# Patient Record
Sex: Male | Born: 1965 | Race: White | Hispanic: No | Marital: Married | Smoking: Never smoker
Health system: Southern US, Community
[De-identification: ages and names within clinical notes are randomized; demographics above are authoritative.]

## PROBLEM LIST (undated history)

## (undated) DIAGNOSIS — Z973 Presence of spectacles and contact lenses: Secondary | ICD-10-CM

## (undated) DIAGNOSIS — C801 Malignant (primary) neoplasm, unspecified: Secondary | ICD-10-CM

## (undated) DIAGNOSIS — D494 Neoplasm of unspecified behavior of bladder: Secondary | ICD-10-CM

## (undated) DIAGNOSIS — E291 Testicular hypofunction: Secondary | ICD-10-CM

## (undated) DIAGNOSIS — J302 Other seasonal allergic rhinitis: Secondary | ICD-10-CM

## (undated) DIAGNOSIS — K219 Gastro-esophageal reflux disease without esophagitis: Secondary | ICD-10-CM

## (undated) DIAGNOSIS — R351 Nocturia: Secondary | ICD-10-CM

## (undated) HISTORY — DX: Testicular hypofunction: E29.1

## (undated) HISTORY — DX: Gastro-esophageal reflux disease without esophagitis: K21.9

## (undated) HISTORY — PX: CARDIAC CATHETERIZATION: SHX172

## (undated) HISTORY — PX: NO PAST SURGERIES: SHX2092

---

## 2002-10-28 ENCOUNTER — Encounter: Payer: Self-pay | Admitting: Family Medicine

## 2002-10-28 ENCOUNTER — Ambulatory Visit (HOSPITAL_COMMUNITY): Admission: RE | Admit: 2002-10-28 | Discharge: 2002-10-28 | Payer: Self-pay | Admitting: Family Medicine

## 2002-11-21 ENCOUNTER — Ambulatory Visit (HOSPITAL_COMMUNITY): Admission: RE | Admit: 2002-11-21 | Discharge: 2002-11-21 | Payer: Self-pay | Admitting: Cardiology

## 2009-05-13 ENCOUNTER — Encounter: Payer: Self-pay | Admitting: Internal Medicine

## 2009-05-28 ENCOUNTER — Ambulatory Visit: Payer: Self-pay | Admitting: Internal Medicine

## 2009-05-28 DIAGNOSIS — K219 Gastro-esophageal reflux disease without esophagitis: Secondary | ICD-10-CM

## 2009-05-28 DIAGNOSIS — E291 Testicular hypofunction: Secondary | ICD-10-CM

## 2009-05-28 DIAGNOSIS — R21 Rash and other nonspecific skin eruption: Secondary | ICD-10-CM | POA: Insufficient documentation

## 2009-05-28 LAB — CONVERTED CEMR LAB
ALT: 34 units/L (ref 0–53)
AST: 27 units/L (ref 0–37)
Albumin: 4.5 g/dL (ref 3.5–5.2)
Alkaline Phosphatase: 77 units/L (ref 39–117)
BUN: 11 mg/dL (ref 6–23)
Basophils Absolute: 0.1 10*3/uL (ref 0.0–0.1)
Basophils Relative: 1 % (ref 0.0–3.0)
Bilirubin, Direct: 0 mg/dL (ref 0.0–0.3)
CO2: 22 meq/L (ref 19–32)
Calcium: 10.1 mg/dL (ref 8.4–10.5)
Chloride: 103 meq/L (ref 96–112)
Creatinine, Ser: 0.9 mg/dL (ref 0.4–1.5)
Eosinophils Absolute: 0.3 10*3/uL (ref 0.0–0.7)
Eosinophils Relative: 5.4 % — ABNORMAL HIGH (ref 0.0–5.0)
GFR calc non Af Amer: 97.15 mL/min (ref >60)
Glucose, Bld: 73 mg/dL (ref 70–99)
HCT: 43.4 % (ref 39.0–52.0)
Hemoglobin: 15.1 g/dL (ref 13.0–17.0)
Lymphocytes Relative: 31.2 % (ref 12.0–46.0)
Lymphs Abs: 2 10*3/uL (ref 0.7–4.0)
MCHC: 34.8 g/dL (ref 30.0–36.0)
MCV: 84.6 fL (ref 78.0–100.0)
Monocytes Absolute: 0.6 10*3/uL (ref 0.1–1.0)
Monocytes Relative: 8.9 % (ref 3.0–12.0)
Neutro Abs: 3.4 10*3/uL (ref 1.4–7.7)
Neutrophils Relative %: 53.5 % (ref 43.0–77.0)
Platelets: 238 10*3/uL (ref 150.0–400.0)
Potassium: 4.3 meq/L (ref 3.5–5.1)
RBC: 5.13 M/uL (ref 4.22–5.81)
RDW: 12 % (ref 11.5–14.6)
Sodium: 139 meq/L (ref 135–145)
TSH: 0.61 microintl units/mL (ref 0.35–5.50)
Testosterone: 256.61 ng/dL — ABNORMAL LOW (ref 350.00–890.00)
Total Bilirubin: 0.6 mg/dL (ref 0.3–1.2)
Total Protein: 6.6 g/dL (ref 6.0–8.3)
WBC: 6.4 10*3/uL (ref 4.5–10.5)

## 2009-05-31 ENCOUNTER — Encounter: Payer: Self-pay | Admitting: Internal Medicine

## 2009-06-01 ENCOUNTER — Encounter: Payer: Self-pay | Admitting: Internal Medicine

## 2009-06-16 ENCOUNTER — Ambulatory Visit: Payer: Self-pay | Admitting: Internal Medicine

## 2009-06-16 LAB — CONVERTED CEMR LAB
FSH: 9.4 milliintl units/mL (ref 1.4–18.1)
LH: 3.65 milliintl units/mL (ref 1.50–9.30)
Prolactin: 6.9 ng/mL
Testosterone: 248.88 ng/dL — ABNORMAL LOW (ref 350.00–890.00)

## 2009-08-02 ENCOUNTER — Ambulatory Visit: Payer: Self-pay | Admitting: Internal Medicine

## 2009-08-02 DIAGNOSIS — M7710 Lateral epicondylitis, unspecified elbow: Secondary | ICD-10-CM | POA: Insufficient documentation

## 2009-08-10 ENCOUNTER — Ambulatory Visit: Payer: Self-pay | Admitting: Internal Medicine

## 2009-08-10 DIAGNOSIS — N401 Enlarged prostate with lower urinary tract symptoms: Secondary | ICD-10-CM

## 2009-08-10 LAB — CONVERTED CEMR LAB
Bilirubin Urine: NEGATIVE
Cholesterol: 191 mg/dL (ref 0–200)
Direct LDL: 125 mg/dL
HDL: 28.5 mg/dL — ABNORMAL LOW (ref >39.00)
Hemoglobin, Urine: NEGATIVE
Ketones, ur: NEGATIVE mg/dL
Leukocytes, UA: NEGATIVE
Nitrite: NEGATIVE
PSA: 1.2 ng/mL (ref 0.10–4.00)
Specific Gravity, Urine: 1.025 (ref 1.000–1.030)
Total CHOL/HDL Ratio: 7
Total Protein, Urine: NEGATIVE mg/dL
Triglycerides: 212 mg/dL — ABNORMAL HIGH (ref 0.0–149.0)
Urine Glucose: NEGATIVE mg/dL
Urobilinogen, UA: 0.2 (ref 0.0–1.0)
VLDL: 42.4 mg/dL — ABNORMAL HIGH (ref 0.0–40.0)
pH: 6 (ref 5.0–8.0)

## 2009-08-11 ENCOUNTER — Encounter: Payer: Self-pay | Admitting: Internal Medicine

## 2009-10-21 ENCOUNTER — Telehealth: Payer: Self-pay | Admitting: Internal Medicine

## 2009-10-21 ENCOUNTER — Encounter: Payer: Self-pay | Admitting: Internal Medicine

## 2009-12-27 ENCOUNTER — Telehealth: Payer: Self-pay | Admitting: Internal Medicine

## 2010-03-07 ENCOUNTER — Ambulatory Visit: Payer: Self-pay | Admitting: Internal Medicine

## 2010-03-07 DIAGNOSIS — J019 Acute sinusitis, unspecified: Secondary | ICD-10-CM | POA: Insufficient documentation

## 2010-03-07 DIAGNOSIS — J3089 Other allergic rhinitis: Secondary | ICD-10-CM

## 2010-03-08 ENCOUNTER — Telehealth (INDEPENDENT_AMBULATORY_CARE_PROVIDER_SITE_OTHER): Payer: Self-pay | Admitting: *Deleted

## 2010-03-09 ENCOUNTER — Encounter: Payer: Self-pay | Admitting: Internal Medicine

## 2010-03-23 ENCOUNTER — Ambulatory Visit: Payer: Self-pay | Admitting: Internal Medicine

## 2010-07-19 ENCOUNTER — Telehealth: Payer: Self-pay | Admitting: Internal Medicine

## 2010-09-03 ENCOUNTER — Encounter: Payer: Self-pay | Admitting: Family Medicine

## 2010-09-03 ENCOUNTER — Ambulatory Visit
Admission: RE | Admit: 2010-09-03 | Discharge: 2010-09-03 | Payer: Self-pay | Source: Home / Self Care | Attending: Family Medicine | Admitting: Family Medicine

## 2010-09-03 DIAGNOSIS — J1189 Influenza due to unidentified influenza virus with other manifestations: Secondary | ICD-10-CM | POA: Insufficient documentation

## 2010-09-05 ENCOUNTER — Telehealth: Payer: Self-pay | Admitting: Internal Medicine

## 2010-09-20 NOTE — Progress Notes (Signed)
Summary: REFILL  Phone Note Refill Request Call back at [facility] Phone [phone] Call back at [phone] or [phone]   Refills Requested: Medication #1:  VIMOVO 500-20 MG TBEC One by mouth two times a day for elbow pain. Initial call taken by: Lamar Sprinkles, CMA,  October 21, 2009 9:31 AM  Follow-up for Phone Call        done Follow-up by: Etta Grandchild MD,  October 21, 2009 9:40 AM    Prescriptions: VIMOVO 500-20 MG TBEC (NAPROXEN-ESOMEPRAZOLE) One by mouth two times a day for elbow pain  #60 x 11   Entered and Authorized by:   Etta Grandchild MD   Signed by:   Etta Grandchild MD on 10/21/2009   Method used:   [facility]  [address] [address] [id number]       [address]       [place], [place]  [address]       Ph: [phone] or [phone]       Fax: [phone]   RxID:   [id number]

## 2010-09-20 NOTE — Progress Notes (Signed)
Summary: andro gel  Phone Note Refill Request Message from:  Fax from Pharmacy on Dec 27, 2009 12:40 PM  Refills Requested: Medication #1:  ANDROGEL 25 MG/2.5GM GEL Apply one to chest or shoulders once daily #75 GM   Dosage confirmed as above?Dosage Confirmed   Brand Name Necessary? Yes   Supply Requested: 6 months   Last Refilled: 11/08/2009 [id number] Last ov 08/10/09  Next Appointment Scheduled: none Initial call taken by: Orlan Leavens,  Dec 27, 2009 12:41 PM  Follow-up for Phone Call        ok Follow-up by: Etta Grandchild MD,  Dec 27, 2009 12:58 PM  Additional Follow-up for Phone Call Additional follow up Details #1::        [facility] Lisa/pharmacist ok x's 6. Updated EMR Additional Follow-up by: Orlan Leavens,  Dec 27, 2009 1:26 PM    Prescriptions: ANDROGEL 25 MG/2.5GM GEL (TESTOSTERONE) Apply one to chest or shoulders once daily  #30 x 5   Entered by:   Orlan Leavens   Authorized by:   Etta Grandchild MD   Signed by:   Orlan Leavens on 12/27/2009   Method used:   Telephoned to ...       [facility]  [address] [address] [id number]       [address]       [place], [place]  [address]       Ph: [phone] or [phone]       Fax: [phone]   RxID:   [id number]

## 2010-09-20 NOTE — Medication Information (Signed)
Summary: Authorization for PPA / [employer] [place]  Authorization for PPA / [employer] [place]   Imported By: Lennie Odor 03/10/2010 14:47:00  _____________________________________________________________________  External Attachment:    Type:   Image     Comment:   External Document

## 2010-09-20 NOTE — Medication Information (Signed)
Summary: Androgens / [employer] [place]  Androgens / [employer] [place]   Imported By: Lennie Odor 03/10/2010 14:49:10  _____________________________________________________________________  External Attachment:    Type:   Image     Comment:   External Document

## 2010-09-20 NOTE — Progress Notes (Signed)
Summary: PA-Androgens  Phone Note From Pharmacy   Request: Needs authorization from insurer Summary of Call: PA-Androgens faxed to [facility]  ([phone]) , awaiting approval. Initial call taken by: Dagoberto Reef,  March 08, 2010 2:42 PM  Follow-up for Phone Call        Approved 03/09/10-12/02/12, Ref # [id number], PT [place]WARE . Follow-up by: Dagoberto Reef,  March 09, 2010 2:51 PM

## 2010-09-20 NOTE — Progress Notes (Signed)
Summary: refill  Phone Note Refill Request Message from:  Fax from Pharmacy on July 19, 2010 11:49 AM  Refills Requested: Medication #1:  ANDROGEL 25 MG/2.5GM GEL Apply one to chest or shoulders once daily Initial call taken by: Rock Nephew CMA,  July 19, 2010 11:50 AM    Prescriptions: ANDROGEL 25 MG/2.5GM GEL (TESTOSTERONE) Apply one to chest or shoulders once daily  #38mo suppy x 1   Entered by:   Rock Nephew CMA   Authorized by:   Etta Grandchild MD   Signed by:   Rock Nephew CMA on 07/19/2010   Method used:   Telephoned to ...       [facility]  [address] [address] [id number]       [address]       [place], [place]  [address]       Ph: [phone] or [phone]       Fax: [phone]   RxID:   [id number]

## 2010-09-20 NOTE — Progress Notes (Signed)
Summary: OTC Alternative  Phone Note Call from Patient   Summary of Call: Pt's vimovo requires PA, pt aware but wants to know what to try otc while waiting on insurance company.  Initial call taken by: Lamar Sprinkles, CMA,  October 21, 2009 2:38 PM  Follow-up for Phone Call        done Follow-up by: Etta Grandchild MD,  October 21, 2009 2:50 PM  Additional Follow-up for Phone Call Additional follow up Details #1::        Left vm for pt to check with pharmacy and return the call if there were any questions.  Additional Follow-up by: Lamar Sprinkles, CMA,  October 21, 2009 4:36 PM    New/Updated Medications: NAPROXEN 500 MG TABS (NAPROXEN) One by mouth two times a day as needed for pain NEXIUM 40 MG CPDR (ESOMEPRAZOLE MAGNESIUM) One by mouth once daily Prescriptions: NEXIUM 40 MG CPDR (ESOMEPRAZOLE MAGNESIUM) One by mouth once daily  #30 x 11   Entered and Authorized by:   Etta Grandchild MD   Signed by:   Etta Grandchild MD on 10/21/2009   Method used:   [facility]        [facility]  [address] [address] [address] [id number]       [address]       [place], [place]  [address]       Ph: [phone] or [phone]       Fax: [phone]   RxID:   [id number] NAPROXEN 500 MG TABS (NAPROXEN) One by mouth two times a day as needed for pain  #60 x 11   Entered and Authorized by:   Etta Grandchild MD   Signed by:   Etta Grandchild MD on 10/21/2009   Method used:   [facility]        [facility]  [address] [address] [id number]       [address]       [place], [place]  [address]       Ph: [phone] or [phone]       Fax: [phone]   RxID:   [id number]

## 2010-09-20 NOTE — Letter (Signed)
Summary: Out of Work  [facility]  [address] [place], [place] [address]   Phone: [phone]  Fax: [phone]    March 07, 2010   Employee:  Darren Brady    To Whom It May Concern:   For Medical reasons, please excuse the above named employee from work for the following dates:  Monday 03/07/2010 and Tuesday 03/08/2010  If you need additional information, please feel free to contact our office.         Sincerely,    Alvy Beal A CMA for Dr. Sanda Linger

## 2010-09-20 NOTE — Medication Information (Signed)
Summary: Prior authorization/[employer] [place]  Prior authorization/[employer] [place]   Imported By: Lester Latta 10/22/2009 11:06:54  _____________________________________________________________________  External Attachment:    Type:   Image     Comment:   External Document

## 2010-09-20 NOTE — Assessment & Plan Note (Signed)
Summary: ??rocky mounted spotted fever/cd   Vital Signs:  Patient profile:   45 year old male Height:      67 inches Weight:      184.25 pounds BMI:     28.96 O2 Sat:      98 % on Room air Temp:     97.9 degrees F oral Pulse rate:   71 / minute Pulse rhythm:   regular Resp:     16 per minute BP sitting:   112 / 70  (left arm) Cuff size:   large  Vitals Entered By: Rock Nephew CMA (March 07, 2010 1:14 PM)  O2 Flow:  Room air CC: headache, chills, hot flashes, cough, runny nose, congestion, no fever x 03/04/10, URI symptoms Is Patient Diabetic? No Pain Assessment Patient in pain? yes        Primary Care Provider:  Yetta Barre  CC:  headache, chills, hot flashes, cough, runny nose, congestion, no fever x 03/04/10, and URI symptoms.  History of Present Illness:  URI Symptoms      This is a 45 year old man who presents with URI symptoms.  The symptoms began 5 days ago.  The severity is described as moderate.  The patient reports nasal congestion, purulent nasal discharge, sore throat, dry cough, and earache, but denies productive cough and sick contacts.  Associated symptoms include low-grade fever (<100.5 degrees).  The patient denies stiff neck, dyspnea, wheezing, rash, vomiting, diarrhea, use of an antipyretic, and response to antipyretic.  The patient also reports seasonal symptoms, headache, muscle aches, and severe fatigue.  Risk factors for Strep sinusitis include unilateral facial pain, unilateral nasal discharge, double sickening, and absence of cough.  The patient denies the following risk factors for Strep sinusitis: Strep exposure and tender adenopathy.    Preventive Screening-Counseling & Management  Alcohol-Tobacco     Alcohol drinks/day: <1     Alcohol type: beer     >5/day in last 3 mos: no     Alcohol Counseling: not indicated; use of alcohol is not excessive or problematic     Feels need to cut down: no     Feels annoyed by complaints: no     Feels guilty re:  drinking: no     Needs 'eye opener' in am: no     Smoking Status: never  Hep-HIV-STD-Contraception     Hepatitis Risk: no risk noted     HIV Risk: no risk noted     STD Risk: no risk noted  Medications Prior to Update: 1)  Androgel 25 Mg/2.5gm Gel (Testosterone) .... Apply One To Chest or Shoulders Once Daily 2)  Naproxen 500 Mg Tabs (Naproxen) .... One By Mouth Two Times A Day As Needed For Pain 3)  Nexium 40 Mg Cpdr (Esomeprazole Magnesium) .... One By Mouth Once Daily  Current Medications (verified): 1)  Androgel 25 Mg/2.5gm Gel (Testosterone) .... Apply One To Chest or Shoulders Once Daily 2)  Naproxen 500 Mg Tabs (Naproxen) .... One By Mouth Two Times A Day As Needed For Pain 3)  Nexium 40 Mg Cpdr (Esomeprazole Magnesium) .... One By Mouth Once Daily 4)  Avelox 400 Mg Tabs (Moxifloxacin Hcl) .... One By Mouth Once Daily For 10 Days 5)  Guaifenesin Dac 30-10-100 Mg/15ml Soln (Pseudoephedrine-Codeine-Gg) .... 5-10 Ml By Mouth Qid As Needed For Cough and Congestion  Allergies (verified): No Known Drug Allergies  Past History:  Past Medical History: Last updated: 05/28/2009 GERD Normal Cardiac Cath. 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 Family History of CAD Male 1st degree relative <50 Family History High  cholesterol Family History Hypertension Family History Kidney disease Family History Thyroid disease  Social History: Reviewed history from 05/28/2009 and no changes required. 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    Physical Exam  General:  alert, well-developed, well-nourished, well-hydrated, appropriate dress, normal appearance, healthy-appearing, cooperative to examination, and good hygiene.   Head:  Normocephalic and atraumatic without obvious abnormalities. No apparent alopecia or balding. Eyes:  No corneal or conjunctival inflammation noted. EOMI. Perrla. Funduscopic exam benign, without hemorrhages, exudates or papilledema. Vision grossly normal. Ears:  R TM erythema and L TM erythema.   Nose:  no nasal discharge, no airflow obstruction, no intranasal foreign body, no nasal polyps, no nasal mucosal lesions, no mucosal friability, no active bleeding or clots, nasal dischargemucosal pallor, mucosal edema, L maxillary sinus tenderness, and R maxillary sinus tenderness.   Mouth:  no exudates, no posterior lymphoid hypertrophy, no postnasal drip, no pharyngeal crowing, no lesions, no aphthous ulcers, no erosions, no tongue abnormalities, no leukoplakia, no petechiae, and pharyngeal erythema.   Neck:  supple, full ROM, no masses, no thyromegaly, no JVD, normal carotid upstroke, no carotid bruits, no cervical lymphadenopathy, and no neck tenderness.   Lungs:  normal respiratory effort, no intercostal retractions, no accessory muscle use, normal breath sounds, no dullness, no fremitus, no crackles, and no wheezes.   Heart:  normal rate, regular rhythm, no murmur, no gallop, no rub, and no JVD.   Abdomen:  soft, non-tender, normal bowel  sounds, no distention, no masses, no guarding, no rigidity, no rebound tenderness, no abdominal hernia, no inguinal hernia, no hepatomegaly, and no splenomegaly.   Msk:  No deformity or scoliosis noted of thoracic or lumbar spine.   Pulses:  R and L carotid,radial,femoral,dorsalis pedis and posterior tibial pulses are full and equal bilaterally Extremities:  No clubbing, cyanosis, edema, or deformity noted with normal full range of motion of all joints.   Neurologic:  No cranial nerve deficits noted. Station and gait are normal. Plantar reflexes are down-going bilaterally. DTRs are symmetrical throughout. Sensory, motor and coordinative functions appear intact. Skin:  Intact without suspicious lesions or rashes Cervical Nodes:  no anterior cervical adenopathy and no posterior cervical adenopathy.   Axillary Nodes:  no R axillary adenopathy and no L axillary adenopathy.   Inguinal Nodes:  no R inguinal adenopathy and no L inguinal adenopathy.   Psych:  Cognition and judgment appear intact. Alert and cooperative with normal attention span and concentration. No apparent delusions, illusions, hallucinations   Impression & Recommendations:  Problem # 1:  SINUSITIS- ACUTE-NOS (ICD-461.9) Assessment New  His updated medication list for this problem includes:    Avelox 400 Mg Tabs (Moxifloxacin hcl) ..... One by mouth once daily  for 10 days    Guaifenesin Dac 30-10-100 Mg/84ml Soln (Pseudoephedrine-codeine-gg) .Marland Kitchen... 5-10 ml by mouth qid as needed for cough and congestion  Instructed on treatment. Call if symptoms persist or worsen.   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 Apply one to chest or shoulders once daily 2)  Naproxen 500 Mg Tabs (Naproxen) .... One by mouth two times a  day as needed for pain 3)  Nexium 40 Mg Cpdr (Esomeprazole magnesium) .... One by mouth once daily 4)  Avelox 400 Mg Tabs (Moxifloxacin hcl) .... One by mouth once daily for 10 days 5)  Guaifenesin Dac 30-10-100 Mg/60ml Soln (Pseudoephedrine-codeine-gg) .... 5-10 ml by mouth qid as needed for cough and congestion  Patient Instructions: 1)  Please schedule a follow-up appointment in 2 weeks. 2)  Take 650-1000mg  of Tylenol every 4-6 hours as needed for relief of pain or comfort of fever AVOID taking more than 4000mg   in a 24 hour period (can cause liver damage in higher doses). 3)  Take your antibiotic as prescribed until ALL of it is gone, but stop if you develop a rash or swelling and contact our office as soon as possible. 4)  Acute sinusitis symptoms for less than 10 days are not helped by antibiotics.Use warm moist compresses, and over the counter decongestants ( only as directed). Call if no improvement in 5-7 days, sooner if increasing pain, fever, or new symptoms. Prescriptions: GUAIFENESIN DAC 30-10-100 MG/5ML SOLN (PSEUDOEPHEDRINE-CODEINE-GG) 5-10 ml by mouth QID as needed for cough and congestion  #6 ounces x 1   Entered and Authorized by:   Etta Grandchild MD   Signed by:   Etta Grandchild MD on 03/07/2010   Method used:   Print then Give to Patient   RxID:   [id number] AVELOX 400 MG TABS (MOXIFLOXACIN HCL) One by mouth once daily for 10 days  #10 x 0   Entered and Authorized by:   Etta Grandchild MD   Signed by:   Etta Grandchild MD on 03/07/2010   Method used:   Samples Given   RxID:   [id number]    Medication Administration  Injection # 1:    Medication: Depo- Medrol 80mg     Diagnosis: ALLERGIC 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)    Route: IM    Site: R deltoid    Exp Date: 11/2012    Lot #: [id number]    Mfr: pfizer    Patient tolerated injection without complications    Given by: Rock Nephew CMA (March 07, 2010 1:35 PM)  Injection # 2:     Medication: Depo- Medrol 40mg     Diagnosis: ALLERGIC 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 Patient Level IV [13244]

## 2010-09-20 NOTE — Progress Notes (Signed)
Summary: Vimovo PA  Phone Note From Pharmacy   Caller: [employer] [place] [phone] Call For: ID:  [id number]  Case: [id number]  Summary of Call: PA request--Vimovo. Will receive form. Initial call taken by: Lucious Groves,  October 21, 2009 10:16 AM  Follow-up for Phone Call        Form was completed and faxed back. Will wait for insurance company reply. Follow-up by: Lucious Groves,  October 21, 2009 1:41 PM     Appended Document: Vimovo PA Vimovo has been denied. Per insurance company the patient does not have any risk factors in regards to NSAIDS and this prescription is only covered for specific conditions (osteoarthritis, RA, and ankylosing spondylitis). Please advise of alternative.

## 2010-09-20 NOTE — Assessment & Plan Note (Signed)
Summary: 2 WK ROV /NWS  #   Vital Signs:  Patient profile:   45 year old male Height:      67 inches Weight:      181.25 pounds BMI:     28.49 O2 Sat:      97 % on Room air Temp:     98.7 degrees F oral Pulse rate:   67 / minute Pulse rhythm:   regular Resp:     16 per minute BP sitting:   106 / 62  (left arm) Cuff size:   large  Vitals Entered By: Rock Nephew CMA (March 23, 2010 3:34 PM)  Nutrition Counseling: Patient's BMI is greater than 25 and therefore counseled on weight management options.  O2 Flow:  Room air CC: follow-up visit Is Patient Diabetic? No Pain Assessment Patient in pain? no        Primary Care Provider:  Yetta Barre  CC:  follow-up visit.  History of Present Illness: URI f/up-doing well.  Preventive Screening-Counseling & Management  Alcohol-Tobacco     Alcohol drinks/day: <1     Alcohol type: beer     >5/day in last 3 mos: no     Alcohol Counseling: not indicated; use of alcohol is not excessive or problematic     Feels need to cut down: no     Feels annoyed by complaints: no     Feels guilty re: drinking: no     Needs 'eye opener' in am: no     Smoking Status: never  Hep-HIV-STD-Contraception     Hepatitis Risk: no risk noted     HIV Risk: no risk noted     STD Risk: no risk noted  Current Medications (verified): 1)  Androgel 25 Mg/2.5gm Gel (Testosterone) .... Apply One To Chest or Shoulders Once Daily 2)  Naproxen 500 Mg Tabs (Naproxen) .... One By Mouth Two Times A Day As Needed For Pain  Allergies (verified): No Known Drug Allergies  Past History:  Past Medical History: Last updated: 05/28/2009 GERD Normal Cardiac Cath. 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 Family History of CAD Male 1st degree relative <50 Family History High cholesterol Family History Hypertension Family History Kidney disease Family History Thyroid disease  Social History: Reviewed history from 05/28/2009 and no changes required. 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   ENT:  Denies ear discharge, earache, hoarseness, nasal congestion, nosebleeds, postnasal drainage, ringing in ears, sinus pressure, and sore throat. MS:  Denies joint pain, joint redness, joint swelling, loss of strength, low back pain, muscle aches, and muscle weakness.  Physical Exam  General:  alert, well-developed, well-nourished, well-hydrated, appropriate dress, normal appearance, healthy-appearing, cooperative to examination, and good hygiene.   Head:  Normocephalic and atraumatic without obvious abnormalities.  No apparent alopecia or balding. Ears:  R ear normal and L ear normal.   Nose:  External nasal examination shows no deformity or inflammation. Nasal mucosa are pink and moist without lesions or exudates. Mouth:  Oral mucosa and oropharynx without lesions or exudates.  Teeth in good repair. Neck:  supple, full ROM, no masses, no thyromegaly, no JVD, normal carotid upstroke, no carotid bruits, no cervical  lymphadenopathy, and no neck tenderness.   Lungs:  normal respiratory effort, no intercostal retractions, no accessory muscle use, normal breath sounds, no dullness, no fremitus, no crackles, and no wheezes.   Heart:  normal rate, regular rhythm, no murmur, no gallop, no rub, and no JVD.   Abdomen:  soft, non-tender, normal bowel sounds, no distention, no masses, no guarding, no rigidity, no rebound tenderness, no abdominal hernia, no inguinal hernia, no hepatomegaly, and no splenomegaly.   Msk:  No deformity or scoliosis noted of thoracic or lumbar spine.   Pulses:  R and L carotid,radial,femoral,dorsalis pedis and posterior tibial pulses are full and equal bilaterally Extremities:  No clubbing, cyanosis, edema, or deformity noted with normal full range of motion of all joints.   Neurologic:  No cranial nerve deficits noted. Station and gait are normal. Plantar reflexes are down-going bilaterally. DTRs are symmetrical throughout. Sensory, motor and coordinative functions appear intact. Skin:  Intact without suspicious lesions or rashes Cervical Nodes:  no anterior cervical adenopathy and no posterior cervical adenopathy.   Axillary Nodes:  no R axillary adenopathy and no L axillary adenopathy.   Inguinal Nodes:  no R inguinal adenopathy and no L inguinal adenopathy.   Psych:  Cognition and judgment appear intact. Alert and cooperative with normal attention span and concentration. No apparent delusions, illusions, hallucinations   Impression & Recommendations:  Problem # 1:  SINUSITIS- ACUTE-NOS (ICD-461.9) Assessment Improved  The following medications were removed from the medication list:    Guaifenesin Dac 30-10-100 Mg/21ml Soln (Pseudoephedrine-codeine-gg) .Marland Kitchen... 5-10 ml by mouth qid as needed for cough and congestion  Instructed on treatment. Call if symptoms persist or worsen.   Problem # 2:  EPICONDYLITIS, LEFT (ICD-726.32) Assessment: Improved  Complete Medication List: 1)   Androgel 25 Mg/2.5gm Gel (Testosterone) .... Apply one to chest or shoulders once daily 2)  Naproxen 500 Mg Tabs (Naproxen) .... One by mouth two times a day as needed for pain  Patient Instructions: 1)  Please schedule a follow-up appointment as needed. 2)  Get plenty of rest, drink lots of clear liquids, and use Tylenol or Ibuprofen for fever and comfort. Return in 7-10 days if you're not better:sooner if you're feeling worse.

## 2010-09-22 NOTE — Progress Notes (Signed)
Summary: Call Report  Phone Note Other Incoming   Caller: [facility] Summary of Call: [facility] Triage Call Report Triage Record Num: [id number] Operator: Audelia Hives Patient Name: Darren Brady Call Date & Time: 09/03/2010 9:44:11AM Patient Phone: [phone] PCP: Sanda Linger Patient Gender: Male PCP Fax : Patient DOB: 1966/05/23 Practice Name: [facility] Reason for Call: [name]/Spouse calling regarding sore throat 09/02/10, started with headache, chills, sore throat, body aches, afebrile. Per Selena Batten she thinks he has the flu. Drinking well. Aleve D giving for headache and is relieved some. [name] states per pt if he gets any worse he will need to see the MD. Connected to [place] at the office and was told to bring to the office this am 09/03/10. Protocol(s) Used: Flu-Like Symptoms Recommended Outcome per Protocol: Provide Home/Self Care Reason for Outcome: Flu-like illness AND not in a high risk group Care Advice:  ~ 01/ Initial call taken by: Margaret Pyle, CMA,  September 05, 2010 8:26 AM

## 2010-09-22 NOTE — Assessment & Plan Note (Signed)
Summary: flu   Vital Signs:  Patient profile:   45 year old male Height:      67 inches Weight:      184 pounds BMI:     28.92 O2 Sat:      95 % on Room air Temp:     98.6 degrees F oral Pulse rate:   84 / minute BP sitting:   120 / 84  (left arm) Cuff size:   large  Vitals Entered By: Payton Spark CMA (September 03, 2010 10:56 AM)  O2 Flow:  Room air CC: Head congestion and ST x 2 days.   Primary Care Provider:  Etta Grandchild MD  CC:  Head congestion and ST x 2 days.Marland Kitchen  History of Present Illness: 45 yo WM presents for sore throat that started yesterday.  He feels worse today.  Subjective fevers, bodyaches.  Took Aleve D his AM which has helped.  He has nasal congestion but not much rhinorrhea.  Not coughing yet.  He did not get the flu shot this year.  He has bodyaches and feels clammy. Denies GI upset.  Denies rash.  He is not a smoker.  Denies chest tightness or SOB.      Allergies: No Known Drug Allergies  Past History:  Past Medical History: Reviewed history from 05/28/2009 and no changes required. GERD Normal Cardiac Cath. in 2005  Social History: Reviewed history from 05/28/2009 and no changes required. Occupation: Dietitian Married Never Smoked Alcohol use-no Drug use-no Regular exercise-yes  Review of Systems      See HPI  Physical Exam  General:  alert, well-developed, well-nourished, and well-hydrated.  here with wife Head:  normocephalic and atraumatic.  sinuses NTTP Eyes:  conjunctiva clear Ears:  EACs patent; TMs translucent and gray with good cone of light and bony landmarks.  Nose:  scant rhinorrhea Mouth:  o/p injected w/o tonsilar hypertrophy, exudates or vesicles Neck:  no masses.   Lungs:  Normal respiratory effort, chest expands symmetrically. Lungs are clear to auscultation, no crackles or wheezes. Heart:  RRR w/o M Abdomen:  soft and non-tender.   Neurologic:  no nuchal rigidity Skin:  skin is hot and  diaphoretic Cervical Nodes:  No lymphadenopathy noted Psych:  good eye contact.     Impression & Recommendations:  Problem # 1:  INFLUENZA (ICD-487.8) Clinically, he has the flu with a negative rapid strep. Since he is day 2 of illness, will start him on Tamiflu along with supportive care measures.    call PCP if not starting to improve in the next 5-7 days. I did go ahead and prophylax his wife with Tamilflu 75 mg once daily x 10 days.  Complete Medication List: 1)  Androgel 25 Mg/2.5gm Gel (Testosterone) .... Apply one to chest or shoulders once daily 2)  Naproxen 500 Mg Tabs (Naproxen) .... One by mouth two times a day as needed for pain 3)  Tamiflu 75 Mg Caps (Oseltamivir phosphate) .Marland Kitchen.. 1 capsule by mouth two times a day x 5 days  Patient Instructions: 1)  Start TAMIFLU today and take for 5 days. 2)  Rest, hydrate and take an OTC cold and flu medication for symptom relief.   3)  Avoid work for the next 3-4 days. 4)  You are contagious. 5)  Call you PCP if you are feeling worse or if flu symptoms are not improving after 7-10 days. Prescriptions: TAMIFLU 75 MG CAPS (OSELTAMIVIR PHOSPHATE) 1 capsule by mouth two times a day  x 5 days  #10 x 0   Entered and Authorized by:   Seymour Bars DO   Signed by:   Seymour Bars DO on 09/03/2010   Method used:   Electronically to        [facility]  [address] [address] [id number] (retail)       [address]       [place], [place]  [address]       Ph: [phone] or [phone]       Fax: [phone]   RxID:   [id number]    Orders Added: 1)  Est. Patient Level III [27253]  Appended Document: flu     Allergies: No Known Drug Allergies   Complete Medication List: 1)  Androgel 25 Mg/2.5gm Gel (Testosterone) .... Apply one to chest or shoulders once daily 2)  Naproxen 500 Mg Tabs (Naproxen) .... One by mouth two times a day as needed for pain 3)  Tamiflu 75 Mg Caps (Oseltamivir phosphate) .Marland Kitchen.. 1 capsule by mouth two times  a day x 5 days  Other Orders: Rapid Strep (66440)   Orders Added: 1)  Rapid Strep [34742]     Laboratory Results    Other Tests  Rapid Strep: negative

## 2010-09-22 NOTE — Letter (Signed)
Summary: Out of Work  [facility]  [address] [place], [place] [address]   Phone: [phone]  Fax: [phone]    September 03, 2010   Employee:  Darren Brady    To Whom It May Concern:   For Medical reasons, please excuse the above named employee from work for the following dates:  Start:   Jan 14th, 15th, 16th  End:   Jan 17th  If you need additional information, please feel free to contact our office.         Sincerely,    Seymour Bars DO

## 2011-01-06 NOTE — Cardiovascular Report (Signed)
   NAME:  Darren Brady, Darren Brady NO.:  [id number]   MEDICAL RECORD NO.:  [id number]                   PATIENT TYPE:  OIB   LOCATION:  2857                                 FACILITY:  [facility]   PHYSICIAN:  Salvadore Farber, M.D. [facility]         DATE OF BIRTH:  Jan 12, 1966   DATE OF PROCEDURE:  11/21/2002  DATE OF DISCHARGE:  11/21/2002                              CARDIAC CATHETERIZATION   PROCEDURE:  Left heart catheterization, left ventriculogram, coronary  angiography, Matrix closure of right femoral artery.   INDICATIONS FOR PROCEDURE:  The patient is a 45 year old gentleman with a  family history remarkable for premature atherosclerotic disease in multiple  members.  He has been having exertional chest discomfort for the past 2  months.  He is, therefore, referred for diagnostic angiography.   DIAGNOSTIC TECHNIQUE:  Informed consent was obtained.  The patient consented  to participate in the Matrix trial of femoral arteriotomy closure device.  Under 1% lidocaine local anesthesia, a 6-French sheath was placed in the  right femoral artery using the modified Seldinger technique.  Diagnostic  angiography and ventriculography were performed using the JL4, JR4, and  pigtail catheters.  The arteriotomy was closed using the Matrix device,  resulting in complete hemostasis.  The patient tolerated the procedure well  and was transferred to the holding room in stable condition.   COMPLICATIONS:  None.   FINDINGS:  1. LV:  108/9/17.  EF 60% without regional wall motion abnormality.  2. No aortic stenosis or mitral regurgitation.  3. Left main:  Angiographically normal.  4. LAD:  The LAD is a moderate-sized vessel giving rise to 2 diagonal     branches.  It is angiographically normal.  5. Circumflex:  The circumflex is a moderate-sized vessel, giving rise to a     single large obtuse marginal branch.  It is angiographically normal.  6. RCA:  The right coronary artery is  a large, dominant vessel.  It is     angiographically normal.   IMPRESSION/RECOMMENDATIONS:  1. Angiographically normal coronary arteries.  2.     Normal left ventricular size and systolic function.  3. No areas of stenosis or mitral regurgitation.   I suspect a noncardiac etiology to his chest pain.  The patient is to follow  up with Dr. Sherril Croon for further evaluation.                                               Salvadore Farber, M.D. [facility]    WED/MEDQ  D:  11/21/2002  T:  11/23/2002  Job:  [id number]   cc:   Doreen Beam  [address]  [place]  [place] [address]  Fax: [phone]   Jonelle Sidle, M.D. [facility]

## 2011-06-10 ENCOUNTER — Encounter: Payer: Self-pay | Admitting: Internal Medicine

## 2011-06-12 ENCOUNTER — Ambulatory Visit (INDEPENDENT_AMBULATORY_CARE_PROVIDER_SITE_OTHER): Payer: 59 | Admitting: Internal Medicine

## 2011-06-12 ENCOUNTER — Encounter: Payer: Self-pay | Admitting: Internal Medicine

## 2011-06-12 ENCOUNTER — Other Ambulatory Visit: Payer: Self-pay | Admitting: Internal Medicine

## 2011-06-12 ENCOUNTER — Other Ambulatory Visit (INDEPENDENT_AMBULATORY_CARE_PROVIDER_SITE_OTHER): Payer: 59

## 2011-06-12 DIAGNOSIS — N401 Enlarged prostate with lower urinary tract symptoms: Secondary | ICD-10-CM

## 2011-06-12 DIAGNOSIS — Z0000 Encounter for general adult medical examination without abnormal findings: Secondary | ICD-10-CM

## 2011-06-12 DIAGNOSIS — E291 Testicular hypofunction: Secondary | ICD-10-CM

## 2011-06-12 LAB — URINALYSIS, ROUTINE W REFLEX MICROSCOPIC
Bilirubin Urine: NEGATIVE
Hgb urine dipstick: NEGATIVE
Ketones, ur: NEGATIVE
Urine Glucose: NEGATIVE
Urobilinogen, UA: 0.2 (ref 0.0–1.0)

## 2011-06-12 LAB — COMPREHENSIVE METABOLIC PANEL
ALT: 36 U/L (ref 0–53)
CO2: 29 mEq/L (ref 19–32)
Calcium: 9.5 mg/dL (ref 8.4–10.5)
Chloride: 104 mEq/L (ref 96–112)
Creatinine, Ser: 1 mg/dL (ref 0.4–1.5)
GFR: 89.37 mL/min (ref >60.00)
Sodium: 141 mEq/L (ref 135–145)
Total Protein: 7 g/dL (ref 6.0–8.3)

## 2011-06-12 LAB — PSA: PSA: 1.42 ng/mL (ref 0.10–4.00)

## 2011-06-12 LAB — CBC WITH DIFFERENTIAL/PLATELET
Basophils Relative: 0.6 % (ref 0.0–3.0)
Eosinophils Absolute: 0.4 10*3/uL (ref 0.0–0.7)
Eosinophils Relative: 5.6 % — ABNORMAL HIGH (ref 0.0–5.0)
Hemoglobin: 15.4 g/dL (ref 13.0–17.0)
Lymphocytes Relative: 25.1 % (ref 12.0–46.0)
MCHC: 34.2 g/dL (ref 30.0–36.0)
Neutro Abs: 4.3 10*3/uL (ref 1.4–7.7)
Neutrophils Relative %: 61.3 % (ref 43.0–77.0)
RBC: 5.28 Mil/uL (ref 4.22–5.81)
WBC: 7.1 10*3/uL (ref 4.5–10.5)

## 2011-06-12 LAB — LIPID PANEL: HDL: 37.5 mg/dL — ABNORMAL LOW (ref >39.00)

## 2011-06-12 MED ORDER — TESTOSTERONE 50 MG/5GM (1%) TD GEL: 10.0000 g | Freq: Every day | TRANSDERMAL | Status: DC

## 2011-06-12 NOTE — Assessment & Plan Note (Signed)
I think his insurance prefers testim so I made that change and I will also check his labs today

## 2011-06-12 NOTE — Patient Instructions (Signed)
Health Maintenance, Males A healthy lifestyle and preventative care can promote health and wellness.  Maintain regular health, dental, and eye exams.   Eat a healthy diet. Foods like vegetables, fruits, whole grains, low-fat dairy products, and lean protein foods contain the nutrients you need without too many calories. Decrease your intake of foods high in solid fats, added sugars, and salt. Get information about a proper diet from your caregiver, if necessary.   Regular physical exercise is one of the most important things you can do for your health. Most adults should get at least 150 minutes of moderate-intensity exercise (any activity that increases your heart rate and causes you to sweat) each week. In addition, most adults need muscle-strengthening exercises on 2 or more days a week.    Maintain a healthy weight. The body mass index (BMI) is a screening tool to identify possible weight problems. It provides an estimate of body fat based on height and weight. Your caregiver can help determine your BMI, and can help you achieve or maintain a healthy weight. For adults 20 years and older:   A BMI below 18.5 is considered underweight.   A BMI of 18.5 to 24.9 is normal.   A BMI of 25 to 29.9 is considered overweight.   A BMI of 30 and above is considered obese.   Maintain normal blood lipids and cholesterol by exercising and minimizing your intake of saturated fat. Eat a balanced diet with plenty of fruits and vegetables. Blood tests for lipids and cholesterol should begin at age 20 and be repeated every 5 years. If your lipid or cholesterol levels are high, you are over 50, or you are a high risk for heart disease, you may need your cholesterol levels checked more frequently.Ongoing high lipid and cholesterol levels should be treated with medicines, if diet and exercise are not effective.   If you smoke, find out from your caregiver how to quit. If you do not use tobacco, do not start.    If you choose to drink alcohol, do not exceed 2 drinks per day. One drink is considered to be 12 ounces (355 mL) of beer, 5 ounces (148 mL) of wine, or 1.5 ounces (44 mL) of liquor.   Avoid use of street drugs. Do not share needles with anyone. Ask for help if you need support or instructions about stopping the use of drugs.   High blood pressure causes heart disease and increases the risk of stroke. Blood pressure should be checked at least every 1 to 2 years. Ongoing high blood pressure should be treated with medicines if weight loss and exercise are not effective.   If you are 45 to 46 years old, ask your caregiver if you should take aspirin to prevent heart disease.   Diabetes screening involves taking a blood sample to check your fasting blood sugar level. This should be done once every 3 years, after age 45, if you are within normal weight and without risk factors for diabetes. Testing should be considered at a younger age or be carried out more frequently if you are overweight and have at least 1 risk factor for diabetes.   Colorectal cancer can be detected and often prevented. Most routine colorectal cancer screening begins at the age of 50 and continues through age 75. However, your caregiver may recommend screening at an earlier age if you have risk factors for colon cancer. On a yearly basis, your caregiver may provide home test kits to check for hidden   blood in the stool. Use of a small camera at the end of a tube, to directly examine the colon (sigmoidoscopy or colonoscopy), can detect the earliest forms of colorectal cancer. Talk to your caregiver about this at age 50, when routine screening begins. Direct examination of the colon should be repeated every 5 to 10 years through age 75, unless early forms of pre-cancerous polyps or small growths are found.   Healthy men should no longer receive prostate-specific antigen (PSA) blood tests as part of routine cancer screening. Consult with  your caregiver about prostate cancer screening.   Practice safe sex. Use condoms and avoid high-risk sexual practices to reduce the spread of sexually transmitted infections (STIs).   Use sunscreen with a sun protection factor (SPF) of 30 or greater. Apply sunscreen liberally and repeatedly throughout the day. You should seek shade when your shadow is shorter than you. Protect yourself by wearing long sleeves, pants, a wide-brimmed hat, and sunglasses year round, whenever you are outdoors.   Notify your caregiver of new moles or changes in moles, especially if there is a change in shape or color. Also notify your caregiver if a mole is larger than the size of a pencil eraser.   A one-time screening for abdominal aortic aneurysm (AAA) and surgical repair of large AAAs by sound wave imaging (ultrasonography) is recommended for ages 65 to 75 years who are current or former smokers.   Stay current with your immunizations.  Document Released: 02/03/2008 Document Revised: 04/19/2011 Document Reviewed: 01/02/2011 [employer] Patient Information 2012 [employer]. 

## 2011-06-12 NOTE — Assessment & Plan Note (Signed)
He has no s/s, I will check his PSA today

## 2011-06-12 NOTE — Assessment & Plan Note (Signed)
Exam done, labs ordered, pt ed material was given 

## 2011-06-12 NOTE — Progress Notes (Signed)
Subjective:    Patient ID: Darren Brady, male    DOB: 10/06/1965, 45 y.o.   MRN: [id number]  HPI He returns for f/up and tells me that he has been out of andro-gel for about 2 weeks b/c his insurance will not pay for it and has been feeling fatigued and irritable.   Review of Systems  Constitutional: Positive for fatigue. Negative for fever, chills, diaphoresis, activity change, appetite change and unexpected weight change.  HENT: Negative.   Eyes: Negative.   Respiratory: Negative for apnea, cough, choking, chest tightness, shortness of breath, wheezing and stridor.   Cardiovascular: Negative for chest pain, palpitations and leg swelling.  Gastrointestinal: Negative for nausea, vomiting, abdominal pain, diarrhea, constipation, blood in stool, abdominal distention and anal bleeding.  Genitourinary: Negative for dysuria, urgency, frequency, hematuria, flank pain, decreased urine volume, discharge, penile swelling, scrotal swelling, enuresis, difficulty urinating, genital sores, penile pain and testicular pain.  Musculoskeletal: Negative for myalgias, back pain, joint swelling, arthralgias and gait problem.  Skin: Negative for color change, pallor, rash and wound.  Neurological: Negative for dizziness, tremors, seizures, syncope, facial asymmetry, speech difficulty, weakness, light-headedness, numbness and headaches.  Hematological: Negative for adenopathy. Does not bruise/bleed easily.  Psychiatric/Behavioral: Positive for dysphoric mood. Negative for suicidal ideas, hallucinations, behavioral problems, confusion, sleep disturbance, self-injury, decreased concentration and agitation. The patient is not nervous/anxious and is not hyperactive.        Objective:   Physical Exam  Vitals reviewed. Constitutional: He is oriented to person, place, and time. He appears well-developed and well-nourished. No distress.  HENT:  Head: Normocephalic and atraumatic.  Mouth/Throat: Oropharynx is clear  and moist. No oropharyngeal exudate.  Eyes: Conjunctivae and EOM are normal. Pupils are equal, round, and reactive to light. Right eye exhibits no discharge. Left eye exhibits no discharge. No scleral icterus.  Neck: Normal range of motion. Neck supple. No JVD present. No tracheal deviation present. No thyromegaly present.  Cardiovascular: Normal rate, regular rhythm, normal heart sounds and intact distal pulses.  Exam reveals no gallop and no friction rub.   No murmur heard. Pulmonary/Chest: Effort normal and breath sounds normal. No stridor. No respiratory distress. He has no wheezes. He has no rales. He exhibits no tenderness.  Abdominal: Soft. Bowel sounds are normal. He exhibits no distension and no mass. There is no tenderness. There is no rebound and no guarding. Hernia confirmed negative in the right inguinal area and confirmed negative in the left inguinal area.  Genitourinary: Rectum normal, prostate normal, testes normal and penis normal. Rectal exam shows no external hemorrhoid, no internal hemorrhoid, no fissure, no mass, no tenderness and anal tone normal. Guaiac negative stool. Prostate is not enlarged and not tender. Right testis shows no mass, no swelling and no tenderness. Right testis is descended. Left testis shows no mass, no swelling and no tenderness. Left testis is descended. Uncircumcised. No phimosis, paraphimosis, hypospadias, penile erythema or penile tenderness. No discharge found.  Musculoskeletal: Normal range of motion. He exhibits no edema and no tenderness.  Lymphadenopathy:    He has no cervical adenopathy.       Right: No inguinal adenopathy present.       Left: No inguinal adenopathy present.  Neurological: He is oriented to person, place, and time. He displays normal reflexes. No cranial nerve deficit. He exhibits normal muscle tone. Coordination normal.  Skin: Skin is warm and dry. No rash noted. He is not diaphoretic. No erythema. No pallor.  Psychiatric: He has  a  normal mood and affect. His behavior is normal. Judgment and thought content normal.          Assessment & Plan:

## 2011-06-13 ENCOUNTER — Encounter: Payer: Self-pay | Admitting: Internal Medicine

## 2011-06-13 LAB — TESTOSTERONE, FREE, TOTAL, SHBG
Sex Hormone Binding: 26 nmol/L (ref 13–71)
Testosterone, Free: 118.3 pg/mL (ref 47.0–244.0)

## 2011-06-15 ENCOUNTER — Telehealth: Payer: Self-pay | Admitting: *Deleted

## 2011-06-15 NOTE — Telephone Encounter (Signed)
1 ml every 2 weeks

## 2011-06-15 NOTE — Telephone Encounter (Signed)
Please set that up

## 2011-06-15 NOTE — Telephone Encounter (Signed)
Wife left VM - RX for androgel is not covered. She called ins, they will cover injectable testosterone and pt would like Rx for this.

## 2011-06-15 NOTE — Telephone Encounter (Signed)
Please specify dose.

## 2011-06-16 MED ORDER — TESTOSTERONE CYPIONATE 200 MG/ML IM SOLN: 200.0000 mg | INTRAMUSCULAR | Status: DC

## 2011-06-16 NOTE — Telephone Encounter (Signed)
Spouse advised, Rx called into pharmacy.

## 2011-06-19 ENCOUNTER — Encounter: Payer: Self-pay | Admitting: Internal Medicine

## 2011-06-19 ENCOUNTER — Ambulatory Visit (INDEPENDENT_AMBULATORY_CARE_PROVIDER_SITE_OTHER): Payer: 59 | Admitting: Internal Medicine

## 2011-06-19 VITALS — BP 114/82 | HR 76 | Temp 97.5°F | Resp 14 | Wt 182.2 lb

## 2011-06-19 DIAGNOSIS — J309 Allergic rhinitis, unspecified: Secondary | ICD-10-CM | POA: Insufficient documentation

## 2011-06-19 DIAGNOSIS — E291 Testicular hypofunction: Secondary | ICD-10-CM

## 2011-06-19 DIAGNOSIS — J019 Acute sinusitis, unspecified: Secondary | ICD-10-CM | POA: Insufficient documentation

## 2011-06-19 MED ORDER — TESTOSTERONE MICRONIZED CRYS: CRYSTALS | Status: DC

## 2011-06-19 MED ORDER — AMOXICILLIN-POT CLAVULANATE 500-125 MG PO TABS: 1.0000 | ORAL_TABLET | Freq: Three times a day (TID) | ORAL | Status: AC

## 2011-06-19 MED ORDER — METHYLPREDNISOLONE ACETATE 80 MG/ML IJ SUSP
120.0000 mg | Freq: Once | INTRAMUSCULAR | Status: AC
  Administered 2011-06-19: 120 mg via INTRAMUSCULAR

## 2011-06-19 MED ORDER — TESTOSTERONE CYPIONATE 200 MG/ML IM SOLN
200.0000 mg | Freq: Once | INTRAMUSCULAR | Status: AC
  Administered 2011-06-19: 200 mg via INTRAMUSCULAR

## 2011-06-19 NOTE — Assessment & Plan Note (Signed)
Start augmentin 

## 2011-06-19 NOTE — Progress Notes (Signed)
Addended by: Regis Bill on: 06/19/2011 11:07 AM   Modules accepted: Orders

## 2011-06-19 NOTE — Progress Notes (Signed)
Subjective:    Patient ID: Darren Brady, male    DOB: 1966/01/05, 45 y.o.   MRN: [id number]  Sinusitis This is a new problem. Episode onset: 10 days. The problem has been gradually worsening since onset. There has been no fever. His pain is at a severity of 1/10. The pain is mild. Associated symptoms include congestion, sinus pressure and sneezing. Pertinent negatives include no chills, coughing, diaphoresis, ear pain, headaches, hoarse voice, neck pain, shortness of breath, sore throat or swollen glands. Past treatments include nothing.      Review of Systems  Constitutional: Positive for fatigue. Negative for fever, chills, diaphoresis, activity change, appetite change and unexpected weight change.  HENT: Positive for congestion, rhinorrhea, sneezing, postnasal drip and sinus pressure. Negative for hearing loss, ear pain, nosebleeds, sore throat, hoarse voice, facial swelling, trouble swallowing, neck pain, neck stiffness, dental problem, voice change, tinnitus and ear discharge.   Eyes: Negative.   Respiratory: Negative for apnea, cough, choking, chest tightness, shortness of breath, wheezing and stridor.   Cardiovascular: Negative for chest pain, palpitations and leg swelling.  Gastrointestinal: Negative for nausea, vomiting, abdominal pain, diarrhea and constipation.  Genitourinary: Negative.   Musculoskeletal: Negative.   Skin: Negative for color change, pallor, rash and wound.  Neurological: Negative.  Negative for headaches.  Hematological: Negative for adenopathy. Does not bruise/bleed easily.  Psychiatric/Behavioral: Negative.        Objective:   Physical Exam  Vitals reviewed. Constitutional: He is oriented to person, place, and time. He appears well-developed and well-nourished. No distress.  HENT:  Head: Normocephalic and atraumatic. No trismus in the jaw.  Right Ear: Hearing, tympanic membrane, external ear and ear canal normal.  Left Ear: Hearing, tympanic membrane,  external ear and ear canal normal.  Nose: Mucosal edema and rhinorrhea present. No nose lacerations, sinus tenderness, nasal deformity, septal deviation or nasal septal hematoma. No epistaxis.  No foreign bodies. Right sinus exhibits maxillary sinus tenderness. Right sinus exhibits no frontal sinus tenderness. Left sinus exhibits maxillary sinus tenderness. Left sinus exhibits no frontal sinus tenderness.  Mouth/Throat: Mucous membranes are normal. Mucous membranes are not pale, not dry and not cyanotic. No oral lesions. No uvula swelling. Posterior oropharyngeal erythema present. No oropharyngeal exudate, posterior oropharyngeal edema or tonsillar abscesses.  Eyes: Conjunctivae and EOM are normal. Pupils are equal, round, and reactive to light. Right eye exhibits no discharge. Left eye exhibits no discharge. No scleral icterus.  Neck: Normal range of motion. Neck supple. No JVD present. No tracheal deviation present. No thyromegaly present.  Cardiovascular: Normal rate, regular rhythm, normal heart sounds and intact distal pulses.  Exam reveals no gallop and no friction rub.   No murmur heard. Pulmonary/Chest: Effort normal and breath sounds normal. No stridor. No respiratory distress. He has no wheezes. He has no rales. He exhibits no tenderness.  Abdominal: Soft. Bowel sounds are normal. He exhibits no distension and no mass. There is no tenderness. There is no rebound and no guarding.  Musculoskeletal: Normal range of motion. He exhibits no edema and no tenderness.  Lymphadenopathy:    He has no cervical adenopathy.  Neurological: He is oriented to person, place, and time. He displays normal reflexes. He exhibits normal muscle tone. Coordination normal.  Skin: Skin is warm and dry. No rash noted. He is not diaphoretic. No erythema. No pallor.  Psychiatric: He has a normal mood and affect. His behavior is normal. Judgment and thought content normal.      Lab Results  Component Value Date    WBC 7.1 06/12/2011   HGB 15.4 06/12/2011   HCT 44.9 06/12/2011   PLT 235.0 06/12/2011   GLUCOSE 85 06/12/2011   CHOL 214* 06/12/2011   TRIG 281.0* 06/12/2011   HDL 37.50* 06/12/2011   LDLDIRECT 135.9 06/12/2011   ALT 36 06/12/2011   AST 27 06/12/2011   NA 141 06/12/2011   K 4.1 06/12/2011   CL 104 06/12/2011   CREATININE 1.0 06/12/2011   BUN 20 06/12/2011   CO2 29 06/12/2011   TSH 0.69 06/12/2011   PSA 1.42 06/12/2011      Assessment & Plan:

## 2011-06-19 NOTE — Assessment & Plan Note (Signed)
He is having a difficult time getting his insurance company to cover a topical testosterone agent so I wrote for a compounded product made locally by [facility] and we gave him his first dose of Testosterone today, he will decide which one he can afford and is willing to use

## 2011-06-19 NOTE — Assessment & Plan Note (Signed)
Will try depo-medrol IM for symptom relief

## 2011-06-19 NOTE — Patient Instructions (Signed)

## 2011-07-12 ENCOUNTER — Encounter: Payer: Self-pay | Admitting: Internal Medicine

## 2011-07-12 ENCOUNTER — Ambulatory Visit (INDEPENDENT_AMBULATORY_CARE_PROVIDER_SITE_OTHER): Payer: 59 | Admitting: Internal Medicine

## 2011-07-12 ENCOUNTER — Other Ambulatory Visit (INDEPENDENT_AMBULATORY_CARE_PROVIDER_SITE_OTHER): Payer: 59

## 2011-07-12 VITALS — BP 108/80 | HR 67 | Temp 98.8°F | Resp 16 | Ht 67.0 in | Wt 177.0 lb

## 2011-07-12 DIAGNOSIS — E291 Testicular hypofunction: Secondary | ICD-10-CM

## 2011-07-12 DIAGNOSIS — E781 Pure hyperglyceridemia: Secondary | ICD-10-CM

## 2011-07-12 DIAGNOSIS — J019 Acute sinusitis, unspecified: Secondary | ICD-10-CM

## 2011-07-12 DIAGNOSIS — J309 Allergic rhinitis, unspecified: Secondary | ICD-10-CM

## 2011-07-12 LAB — LIPID PANEL
Cholesterol: 182 mg/dL (ref 0–200)
HDL: 39.3 mg/dL (ref >39.00)
LDL Cholesterol: 119 mg/dL — ABNORMAL HIGH (ref 0–99)
Total CHOL/HDL Ratio: 5
Triglycerides: 117 mg/dL (ref 0.0–149.0)
VLDL: 23.4 mg/dL (ref 0.0–40.0)

## 2011-07-12 MED ORDER — AZELASTINE-FLUTICASONE 137-50 MCG/ACT NA SUSP: 1.0000 | Freq: Two times a day (BID) | NASAL | Status: DC

## 2011-07-12 NOTE — Progress Notes (Signed)
Subjective:    Patient ID: Darren Brady, male    DOB: August 23, 1965, 45 y.o.   MRN: [id number]  Sinusitis This is a recurrent problem. The current episode started in the past 7 days. The problem has been gradually worsening since onset. There has been no fever. He is experiencing no pain. Associated symptoms include congestion and sinus pressure. Pertinent negatives include no chills, coughing, diaphoresis, ear pain, headaches, hoarse voice, neck pain, shortness of breath, sneezing, sore throat or swollen glands. Past treatments include antibiotics. The treatment provided moderate relief.      Review of Systems  Constitutional: Negative for fever, chills, diaphoresis, activity change, appetite change, fatigue and unexpected weight change.  HENT: Positive for congestion, rhinorrhea and sinus pressure. Negative for ear pain, nosebleeds, sore throat, hoarse voice, sneezing, trouble swallowing, neck pain, neck stiffness, dental problem and voice change.   Eyes: Negative.   Respiratory: Negative for cough, shortness of breath, wheezing and stridor.   Cardiovascular: Negative for chest pain, palpitations and leg swelling.  Gastrointestinal: Negative for nausea, vomiting, abdominal pain, diarrhea, constipation, blood in stool, abdominal distention, anal bleeding and rectal pain.  Genitourinary: Negative for dysuria, urgency, frequency, hematuria, flank pain, decreased urine volume, enuresis and difficulty urinating.  Musculoskeletal: Negative for myalgias, back pain, joint swelling, arthralgias and gait problem.  Skin: Negative for pallor, rash and wound.  Neurological: Negative for dizziness, tremors, seizures, syncope, facial asymmetry, speech difficulty, weakness, light-headedness, numbness and headaches.  Hematological: Negative for adenopathy. Does not bruise/bleed easily.  Psychiatric/Behavioral: Negative.        Objective:   Physical Exam  Vitals reviewed. Constitutional: He is oriented  to person, place, and time. He appears well-developed and well-nourished. No distress.  HENT:  Right Ear: Hearing, tympanic membrane, external ear and ear canal normal.  Left Ear: Hearing, tympanic membrane, external ear and ear canal normal.  Nose: Mucosal edema and rhinorrhea present. No nose lacerations, sinus tenderness, nasal deformity, septal deviation or nasal septal hematoma. No epistaxis.  No foreign bodies. Right sinus exhibits maxillary sinus tenderness. Right sinus exhibits no frontal sinus tenderness. Left sinus exhibits no maxillary sinus tenderness and no frontal sinus tenderness.  Mouth/Throat: Oropharynx is clear and moist. No oropharyngeal exudate.  Eyes: Conjunctivae are normal. Right eye exhibits no discharge. Left eye exhibits no discharge. No scleral icterus.  Neck: Normal range of motion. Neck supple. No JVD present. No tracheal deviation present. No thyromegaly present.  Cardiovascular: Normal rate, regular rhythm, normal heart sounds and intact distal pulses.  Exam reveals no gallop and no friction rub.   No murmur heard. Pulmonary/Chest: Effort normal and breath sounds normal. No stridor. No respiratory distress. He has no wheezes. He has no rales. He exhibits no tenderness.  Abdominal: Soft. Bowel sounds are normal. He exhibits no distension and no mass. There is no tenderness. There is no rebound and no guarding.  Musculoskeletal: Normal range of motion. He exhibits no edema and no tenderness.  Lymphadenopathy:    He has no cervical adenopathy.  Neurological: He is oriented to person, place, and time.  Skin: Skin is warm and dry. No rash noted. He is not diaphoretic. No erythema. No pallor.  Psychiatric: He has a normal mood and affect. His behavior is normal. Judgment and thought content normal.      Lab Results  Component Value Date   WBC 7.1 06/12/2011   HGB 15.4 06/12/2011   HCT 44.9 06/12/2011   PLT 235.0 06/12/2011   GLUCOSE 85 06/12/2011  CHOL 214*  06/12/2011   TRIG 281.0* 06/12/2011   HDL 37.50* 06/12/2011   LDLDIRECT 135.9 06/12/2011   ALT 36 06/12/2011   AST 27 06/12/2011   NA 141 06/12/2011   K 4.1 06/12/2011   CL 104 06/12/2011   CREATININE 1.0 06/12/2011   BUN 20 06/12/2011   CO2 29 06/12/2011   TSH 0.69 06/12/2011   PSA 1.42 06/12/2011      Assessment & Plan:

## 2011-07-12 NOTE — Patient Instructions (Signed)
Allergic Rhinitis Allergic rhinitis is when the mucous membranes in the nose respond to allergens. Allergens are particles in the air that cause your body to have an allergic reaction. This causes you to release allergic antibodies. Through a chain of events, these eventually cause you to release histamine into the blood stream (hence the use of antihistamines). Although meant to be protective to the body, it is this release that causes your discomfort, such as frequent sneezing, congestion and an itchy runny nose.  CAUSES  The pollen allergens may come from grasses, trees, and weeds. This is seasonal allergic rhinitis, or "hay fever." Other allergens cause year-round allergic rhinitis (perennial allergic rhinitis) such as house dust mite allergen, pet dander and mold spores.  SYMPTOMS   Nasal stuffiness (congestion).   Runny, itchy nose with sneezing and tearing of the eyes.   There is often an itching of the mouth, eyes and ears.  It cannot be cured, but it can be controlled with medications. DIAGNOSIS  If you are unable to determine the offending allergen, skin or blood testing may find it. TREATMENT   Avoid the allergen.   Medications and allergy shots (immunotherapy) can help.   Hay fever may often be treated with antihistamines in pill or nasal spray forms. Antihistamines block the effects of histamine. There are over-the-counter medicines that may help with nasal congestion and swelling around the eyes. Check with your caregiver before taking or giving this medicine.  If the treatment above does not work, there are many new medications your caregiver can prescribe. Stronger medications may be used if initial measures are ineffective. Desensitizing injections can be used if medications and avoidance fails. Desensitization is when a patient is given ongoing shots until the body becomes less sensitive to the allergen. Make sure you follow up with your caregiver if problems continue. SEEK  MEDICAL CARE IF:   You develop fever (more than 100.5 F (38.1 C).   You develop a cough that does not stop easily (persistent).   You have shortness of breath.   You start wheezing.   Symptoms interfere with normal daily activities.  Document Released: 05/02/2001 Document Revised: 04/19/2011 Document Reviewed: 11/11/2008 ExitCare Patient Information 2012 [employer]. 

## 2011-07-16 ENCOUNTER — Encounter: Payer: Self-pay | Admitting: Internal Medicine

## 2011-07-16 NOTE — Assessment & Plan Note (Signed)
He is doing much better.

## 2011-07-16 NOTE — Assessment & Plan Note (Signed)
He had a good response to the augmentin but then he stopped it too soon, none the less he has a refill that he will get and restart

## 2011-07-16 NOTE — Assessment & Plan Note (Signed)
He will try dymista and see if that helps

## 2011-07-16 NOTE — Assessment & Plan Note (Signed)
I will recheck his FLP 

## 2012-02-08 ENCOUNTER — Other Ambulatory Visit: Payer: Self-pay | Admitting: Internal Medicine

## 2012-02-13 ENCOUNTER — Ambulatory Visit (INDEPENDENT_AMBULATORY_CARE_PROVIDER_SITE_OTHER): Payer: 59 | Admitting: Internal Medicine

## 2012-02-13 ENCOUNTER — Other Ambulatory Visit: Payer: 59

## 2012-02-13 ENCOUNTER — Encounter: Payer: Self-pay | Admitting: Internal Medicine

## 2012-02-13 VITALS — BP 102/74 | HR 58 | Temp 98.2°F | Resp 16 | Wt 188.5 lb

## 2012-02-13 DIAGNOSIS — E291 Testicular hypofunction: Secondary | ICD-10-CM

## 2012-02-13 MED ORDER — TESTOSTERONE MICRONIZED CRYS: CRYSTALS | Status: DC

## 2012-02-13 NOTE — Progress Notes (Signed)
  Subjective:    Patient ID: Darren Brady, male    DOB: 11-16-1965, 46 y.o.   MRN: [id number]  HPI He comes in today for a f/up on his testosterone replacement therapy, he feels well and offers no complaints.   Review of Systems  Constitutional: Negative.   HENT: Negative.   Eyes: Negative.   Respiratory: Negative.   Cardiovascular: Negative.   Gastrointestinal: Negative.   Genitourinary: Negative.   Musculoskeletal: Negative.   Skin: Negative.   Neurological: Negative.   Hematological: Negative.   Psychiatric/Behavioral: Negative.        Objective:   Physical Exam  Vitals reviewed. Constitutional: He is oriented to person, place, and time. He appears well-developed and well-nourished. No distress.  HENT:  Head: Normocephalic and atraumatic.  Mouth/Throat: Oropharynx is clear and moist. No oropharyngeal exudate.  Eyes: Conjunctivae are normal. Right eye exhibits no discharge. Left eye exhibits no discharge. No scleral icterus.  Neck: Normal range of motion. Neck supple. No JVD present. No tracheal deviation present. No thyromegaly present.  Cardiovascular: Normal rate, regular rhythm, normal heart sounds and intact distal pulses.  Exam reveals no gallop and no friction rub.   No murmur heard. Pulmonary/Chest: Effort normal and breath sounds normal. No stridor. No respiratory distress. He has no wheezes. He has no rales. He exhibits no tenderness.  Abdominal: Soft. Bowel sounds are normal. He exhibits no distension and no mass. There is no tenderness. There is no rebound and no guarding.  Musculoskeletal: Normal range of motion. He exhibits no edema and no tenderness.  Lymphadenopathy:    He has no cervical adenopathy.  Neurological: He is oriented to person, place, and time.  Skin: Skin is warm and dry. No rash noted. He is not diaphoretic. No erythema. No pallor.  Psychiatric: He has a normal mood and affect. His behavior is normal. Judgment and thought content normal.      Lab Results  Component Value Date   WBC 7.1 06/12/2011   HGB 15.4 06/12/2011   HCT 44.9 06/12/2011   PLT 235.0 06/12/2011   GLUCOSE 85 06/12/2011   CHOL 182 07/12/2011   TRIG 117.0 07/12/2011   HDL 39.30 07/12/2011   LDLDIRECT 135.9 06/12/2011   LDLCALC 119* 07/12/2011   ALT 36 06/12/2011   AST 27 06/12/2011   NA 141 06/12/2011   K 4.1 06/12/2011   CL 104 06/12/2011   CREATININE 1.0 06/12/2011   BUN 20 06/12/2011   CO2 29 06/12/2011   TSH 0.69 06/12/2011   PSA 1.42 06/12/2011       Assessment & Plan:

## 2012-02-13 NOTE — Patient Instructions (Signed)
Testosterone topical solution What is this medicine? TESTOSTERONE (tes TOS ter one) is the main male hormone. It supports normal male traits such as muscle growth, facial hair, and deep voice. This medicine is used in males to treat low testosterone levels. This medicine may be used for other purposes; ask your health care provider or pharmacist if you have questions. What should I tell my health care provider before I take this medicine? They need to know if you have any of these conditions: -breast cancer -breathing problems while you sleep (sleep apnea) -diabetes -heart disease -if a male partner is pregnant or trying to get pregnant -kidney disease -liver disease -lung disease -prostate cancer, enlargement -an unusual or allergic reaction to testosterone, other medicines, foods, dyes, or preservatives -pregnant or trying to get pregnant -breast-feeding How should I use this medicine? This medicine is applied at the same time every day (preferably in the morning) to clean, dry, intact skin of the armpit. Follow the directions on the prescription label. If you take a bath or shower in the morning, apply the medicine after the bath or shower. If you use deodorants or antiperspirants, use them at least 2 minutes before applying this medicine. Only apply this medicine to the armpits. Do not use on any other body part. To use, remove the cap and the applicator cup from the pump. Fully depress the pump once to dispense solution into the applicator cup. With the applicator cup held upright, wipe the solution down and up into the armpit. Do not use your fingers or hand to rub the medicine into the skin. Allow the skin to dry a few minutes before putting on clothing. The skin solution is flammable. Avoid fire, flame, or smoking until the solution has dried. Wash your hands after use. Avoid bathing or swimming for at least 2 hours after you apply the medicine. A special MedGuide will be given to you  by the pharmacist with each prescription and refill. Be sure to read this information carefully each time. Talk to your pediatrician regarding the use of this medicine in children. Special care may be needed. Overdosage: If you think you've taken too much of this medicine contact a poison control center or emergency room at once. Overdosage: If you think you have taken too much of this medicine contact a poison control center or emergency room at once. NOTE: This medicine is only for you. Do not share this medicine with others. What if I miss a dose? If you miss a dose, use it as soon as you can. If it is almost time for your next dose, use only that dose. Do not use double or extra doses. What may interact with this medicine? -certain medicines for diabetes -certain medicines that treat or prevent blood clots like warfarin -oxyphenbutazone -propranolol -steroid medicines like prednisone or cortisone This list may not describe all possible interactions. Give your health care provider a list of all the medicines, herbs, non-prescription drugs, or dietary supplements you use. Also tell them if you smoke, drink alcohol, or use illegal drugs. Some items may interact with your medicine. What should I watch for while using this medicine? Visit your doctor or health care professional for regular checks on your progress. They will need to check the level of testosterone in your blood. This medicine can transfer from your body to others. If a person or pet comes in contact with the area where this medicine was applied to your skin, they may have a serious risk   of side effects. If you cannot avoid skin-to-skin contact with another person, make sure the site where this medicine was applied is covered with clothing. If accidental contact happens, the skin of the person or pet should be washed right away with soap and water. Also, a male partner who is pregnant or trying to get pregnant should avoid contact  with the gel or treated skin. This medicine may affect blood sugar levels. If you have diabetes, check with your doctor or health care professional before you change your diet or the dose of your diabetic medicine. This drug is banned from use in athletes by most athletic organizations. What side effects may I notice from receiving this medicine? Side effects that you should report to your doctor or health care professional as soon as possible: -allergic reactions like skin rash, itching or hives, swelling of the face, lips, or tongue -breast enlargement -breathing problems -changes in emotions or moods, especially anger, depression, or rage -dark urine -general ill feeling or flu-like symptoms -light-colored stools -loss of appetite, nausea -nausea, vomiting -pain, swelling, warmth in the leg -right upper belly pain -stomach pain -swelling of the ankles, feet, hands -too frequent or persistent erections -trouble passing urine or change in the amount of urine -unusually weak or tired -yellowing of the eyes or skinSide effects that usually do not require medical attention (Report these to your doctor or health care professional if they continue or are bothersome.): -acne -change in sex drive or performance -diarrhea -hair loss -headache This list may not describe all possible side effects. Call your doctor for medical advice about side effects. You may report side effects to FDA at 1-800-FDA-1088. Where should I keep my medicine? Keep out of the reach of children. This medicine can be abused. Keep your medicine in a safe place to protect it from theft. Do not share this medicine with anyone. Selling or giving away this medicine is dangerous and against the law. Store at room temperature between 15 to 30 degrees C (59 to 86 degrees F). Keep closed until use. Protect from heat and light. This medicine is flammable. Avoid exposure to heat, fire, flame, and smoking. Throw away any unused  medicine after the expiration date. NOTE: This sheet is a summary. It may not cover all possible information. If you have questions about this medicine, talk to your doctor, pharmacist, or health care provider.  2012, [employer]. (07/22/2009 10:17:45 AM) 

## 2012-02-14 ENCOUNTER — Encounter: Payer: Self-pay | Admitting: Internal Medicine

## 2012-02-14 LAB — TESTOSTERONE, FREE, TOTAL, SHBG
Sex Hormone Binding: 24 nmol/L (ref 13–71)
Testosterone, Free: 69.4 pg/mL (ref 47.0–244.0)
Testosterone: 297.27 ng/dL — ABNORMAL LOW (ref 300–890)

## 2012-02-14 NOTE — Assessment & Plan Note (Signed)
He is doing well. I do not see any signs of complications, I will check his level today

## 2012-08-15 ENCOUNTER — Encounter: Payer: Self-pay | Admitting: Internal Medicine

## 2012-08-15 ENCOUNTER — Ambulatory Visit (INDEPENDENT_AMBULATORY_CARE_PROVIDER_SITE_OTHER): Payer: 59 | Admitting: Internal Medicine

## 2012-08-15 VITALS — BP 102/68 | HR 85 | Temp 99.8°F

## 2012-08-15 DIAGNOSIS — J111 Influenza due to unidentified influenza virus with other respiratory manifestations: Secondary | ICD-10-CM

## 2012-08-15 MED ORDER — OSELTAMIVIR PHOSPHATE 75 MG PO CAPS: 75.0000 mg | ORAL_CAPSULE | Freq: Two times a day (BID) | ORAL | Status: DC

## 2012-08-15 MED ORDER — PROMETHAZINE-PHENYLEPHRINE 6.25-5 MG/5ML PO SYRP: 5.0000 mL | ORAL_SOLUTION | ORAL | Status: DC | PRN

## 2012-08-15 NOTE — Progress Notes (Signed)
  Subjective:   HPI  complains of flu-like symptoms  Onset <24h ago, rapidly and progressively worse symptoms  associated with extreme myalgia, fever, headache, and exhaustion Also mild nasal congestion, sneezing, sore throat, and cough Minimal relief with OTC meds Precipitated by sick contacts  Past Medical History  Diagnosis Date  . GERD (gastroesophageal reflux disease)   . Hypogonadism male     Review of Systems Constitutional: No unexpected weight change Pulmonary: No pleurisy or hemoptysis Cardiovascular: No chest pain or palpitations     Objective:   Physical Exam BP 102/68  Pulse 85  Temp 99.8 F (37.7 C) (Oral)  SpO2 95% GEN: mildly ill appearing, fatigued and diaphoretic - wife at side HENT: NCAT, no sinus tenderness bilaterally, nares with clear discharge, oropharynx mild erythema, no exudate Eyes: Vision grossly intact, no conjunctivitis Neck: shoddy anterior LAD, supple with FROM Lungs: Clear to auscultation without rhonchi or wheeze, no increased work of breathing Cardiovascular: Regular rate and rhythm, no bilateral edema  Lab Results  Component Value Date   WBC 7.1 06/12/2011   HGB 15.4 06/12/2011   HCT 44.9 06/12/2011   PLT 235.0 06/12/2011   GLUCOSE 85 06/12/2011   CHOL 182 07/12/2011   TRIG 117.0 07/12/2011   HDL 39.30 07/12/2011   LDLDIRECT 135.9 06/12/2011   LDLCALC 119* 07/12/2011   ALT 36 06/12/2011   AST 27 06/12/2011   NA 141 06/12/2011   K 4.1 06/12/2011   CL 104 06/12/2011   CREATININE 1.0 06/12/2011   BUN 20 06/12/2011   CO2 29 06/12/2011   TSH 0.69 06/12/2011   PSA 1.42 06/12/2011      Assessment & Plan:   Viral URI - symptoms clinically consistent with influenza    Explained lack of efficacy for traditional antibiotics in viral disease  Tamiflu prescribed + prescription cough suppression - new prescriptions done  Symptomatic care with Tylenol and/or Advil, hydration and rest - also OTC decongestants, antihistamines,  and/or antitussives and salt gargle advised as needed  Pt education provided

## 2012-08-15 NOTE — Patient Instructions (Signed)
Influenza, Adult Influenza ("the flu") is a viral infection of the respiratory tract. It occurs more often in winter months because people spend more time in close contact with one another. Influenza can make you feel very sick. Influenza easily spreads from person to person (contagious). CAUSES   Influenza is caused by a virus that infects the respiratory tract. You can catch the virus by breathing in droplets from an infected person's cough or sneeze. You can also catch the virus by touching something that was recently contaminated with the virus and then touching your mouth, nose, or eyes. SYMPTOMS   Symptoms typically last 4 to 10 days and may include:  Fever.   Chills.   Headache, body aches, and muscle aches.   Sore throat.   Chest discomfort and cough.   Poor appetite.   Weakness or feeling tired.   Dizziness.   Nausea or vomiting.  DIAGNOSIS   Diagnosis of influenza is often made based on your history and a physical exam. A nose or throat swab test can be done to confirm the diagnosis. RISKS AND COMPLICATIONS You may be at risk for a more severe case of influenza if you smoke cigarettes, have diabetes, have chronic heart disease (such as heart failure) or lung disease (such as asthma), or if you have a weakened immune system. Elderly people and pregnant women are also at risk for more serious infections. The most common complication of influenza is a lung infection (pneumonia). Sometimes, this complication can require emergency medical care and may be life-threatening. PREVENTION   An annual influenza vaccination (flu shot) is the best way to avoid getting influenza. An annual flu shot is now routinely recommended for all adults in the [place] TREATMENT   In mild cases, influenza goes away on its own. Treatment is directed at relieving symptoms. For more severe cases, your caregiver may prescribe antiviral medicines to shorten the sickness. Antibiotic medicines are not effective,  because the infection is caused by a virus, not by bacteria. HOME CARE INSTRUCTIONS  Only take over-the-counter or prescription medicines for pain, discomfort, or fever as directed by your caregiver.   Use a cool mist humidifier to make breathing easier.   Get plenty of rest until your temperature returns to normal. This usually takes 3 to 4 days.   Drink enough fluids to keep your urine clear or pale yellow.   Cover your mouth and nose when coughing or sneezing, and wash your hands well to avoid spreading the virus.   Stay home from work or school until your fever has been gone for at least 1 full day.  SEEK MEDICAL CARE IF:    You have chest pain or a deep cough that worsens or produces more mucus.   You have nausea, vomiting, or diarrhea.  SEEK IMMEDIATE MEDICAL CARE IF:    You have difficulty breathing, shortness of breath, or your skin or nails turn bluish.   You have severe neck pain or stiffness.   You have a severe headache, facial pain, or earache.   You have a worsening or recurring fever.   You have nausea or vomiting that cannot be controlled.  MAKE SURE YOU:  Understand these instructions.   Will watch your condition.   Will get help right away if you are not doing well or get worse.  Document Released: 08/04/2000 Document Revised: 02/06/2012 Document Reviewed: 11/06/2011 ExitCare Patient Information 2013 [employer].    

## 2012-10-14 ENCOUNTER — Telehealth: Payer: Self-pay

## 2012-10-14 DIAGNOSIS — E291 Testicular hypofunction: Secondary | ICD-10-CM

## 2012-10-14 MED ORDER — TESTOSTERONE MICRONIZED CRYS: CRYSTALS | Status: DC

## 2012-10-14 NOTE — Telephone Encounter (Signed)
Pt's spouse called stating [facility] has attempting 3 times to contact this office for refills for Testosterone gel. Please advise on refill?

## 2012-10-14 NOTE — Telephone Encounter (Signed)
done

## 2012-10-15 NOTE — Telephone Encounter (Signed)
Patients wife is calling to make sure this was sent in to [facility], please call to verify

## 2012-10-15 NOTE — Telephone Encounter (Signed)
MD has been out of the office sine 10/02/12 and I have been out of the office since 10/09/12. RX faxed to [facility] today

## 2012-10-15 NOTE — Telephone Encounter (Signed)
Caller: Pharmacy/Holly/; Phone: [phone]; Reason for Call: Pharmacist/Holly calling from [facility] on [facility].  States a Rx for Testosterone Gel was to be called into Pharmacy but has not been received.  RN reviewed [facility]selling [facility] Health Record.  Order noted, per Dr.  Sanda Linger on 09/13/12: Testosterone Gel 1% Place 1 mL onto skin daily; Dispense: 3 Bottle; no refills.  RN noted that above Rx was sent to [facility] in [place].   Above Rx given to Pharmacist/Holly at [facility].  PHARMACIST STATES SHE NEEDS VERIFICATION OF QUANTITY.  STATES MEDICATION IS COMPOUNDED.  STATES NEEDS VERIFICATION IF RX IS TO BE A 3 MONTH SUPPLY.  PLEASE RETURN CALL TO PHARMACIST/HOLLY AT [phone].   RN spoke with Pharmacist/Myra, at [facility] in [place], to cancel above Rx.  States Rx order was not received.

## 2012-11-29 ENCOUNTER — Ambulatory Visit (INDEPENDENT_AMBULATORY_CARE_PROVIDER_SITE_OTHER): Payer: 59 | Admitting: Internal Medicine

## 2012-11-29 ENCOUNTER — Encounter: Payer: Self-pay | Admitting: Internal Medicine

## 2012-11-29 VITALS — BP 110/82 | HR 91 | Temp 98.5°F | Ht 67.0 in | Wt 184.0 lb

## 2012-11-29 DIAGNOSIS — J309 Allergic rhinitis, unspecified: Secondary | ICD-10-CM

## 2012-11-29 DIAGNOSIS — J019 Acute sinusitis, unspecified: Secondary | ICD-10-CM

## 2012-11-29 MED ORDER — METHYLPREDNISOLONE ACETATE 80 MG/ML IJ SUSP
80.0000 mg | Freq: Once | INTRAMUSCULAR | Status: AC
  Administered 2012-11-29: 80 mg via INTRAMUSCULAR

## 2012-11-29 MED ORDER — LEVOFLOXACIN 500 MG PO TABS: 500.0000 mg | ORAL_TABLET | Freq: Every day | ORAL | Status: DC

## 2012-11-29 NOTE — Progress Notes (Signed)
HPI  Pt presents to the clinic today with c/o nasal congestion, sore throat, fever and body aches. This started 2 days ago. He has taken Sudafed and Tylenol without any relief. His wife has similar symptoms. He has no history of allergies or asthma. He has had sick contacts.  Review of Systems    Past Medical History  Diagnosis Date  . GERD (gastroesophageal reflux disease)   . Hypogonadism male     Family History  Problem Relation Age of Onset  . Hyperlipidemia Other   . Hypertension Other   . Kidney disease Other   . Thyroid disease Other   . Coronary artery disease Other   . Cancer Neg Hx     History   Social History  . Marital Status: Married    Spouse Name: N/A    Number of Children: N/A  . Years of Education: N/A   Occupational History  . Assembly Mechanic    Social History Main Topics  . Smoking status: Never Smoker   . Smokeless tobacco: Not on file  . Alcohol Use: No  . Drug Use: No  . Sexually Active: Yes   Other Topics Concern  . Not on file   Social History Narrative   Regular Exercise -  YES          No Known Allergies   Constitutional: Positive headache, fatigue and fever. Denies abrupt weight changes.  HEENT:  Positive eye pain, pressure behind the eyes, facial pain, nasal congestion and sore throat. Denies eye redness, ear pain, ringing in the ears, wax buildup, runny nose or bloody nose. Respiratory: Positive cough and thick green sputum production. Denies difficulty breathing or shortness of breath.  Cardiovascular: Denies chest pain, chest tightness, palpitations or swelling in the hands or feet.   No other specific complaints in a complete review of systems (except as listed in HPI above).  Objective:    BP 110/82  Pulse 91  Temp(Src) 98.5 F (36.9 C) (Oral)  Ht 5\' 7"  (1.702 m)  Wt 184 lb (83.462 kg)  BMI 28.81 kg/m2  SpO2 94% Wt Readings from Last 3 Encounters:  11/29/12 184 lb (83.462 kg)  02/13/12 188 lb 8 oz (85.503 kg)   07/12/11 177 lb (80.287 kg)    General: Appears his stated age, well developed, well nourished in NAD. HEENT: Head: normal shape and size; Eyes: sclera white, no icterus, conjunctiva pink, PERRLA and EOMs intact; Ears: Tm's gray and intact, normal light reflex; Nose: mucosa pink and moist, septum midline; Throat/Mouth: + PND. Teeth present, mucosa pink and moist, no exudate noted, no lesions or ulcerations noted.  Neck: Mild cervical lymphadenopathy. Neck supple, trachea midline. No massses, lumps or thyromegaly present.  Cardiovascular: Normal rate and rhythm. S1,S2 noted.  No murmur, rubs or gallops noted. No JVD or BLE edema. No carotid bruits noted. Pulmonary/Chest: Normal effort and positive vesicular breath sounds. No respiratory distress. No wheezes, rales or ronchi noted.      Assessment & Plan:   Acute bacterial sinusitis secondary to allergic rhinitis  Can use a Neti Pot which can be purchased from your local drug store. Flonase 2 sprays each nostril for 3 days and then as needed. Levaquin x 7 days 80 mg depo IM now Take OTC zyrtec 10 mg daily  RTC as needed or if symptoms persist.

## 2012-11-29 NOTE — Patient Instructions (Signed)

## 2012-11-29 NOTE — Addendum Note (Signed)
Addended by: Carin Primrose on: 11/29/2012 01:38 PM   Modules accepted: Orders

## 2013-02-10 ENCOUNTER — Encounter: Payer: Self-pay | Admitting: Internal Medicine

## 2013-02-10 ENCOUNTER — Other Ambulatory Visit (INDEPENDENT_AMBULATORY_CARE_PROVIDER_SITE_OTHER): Payer: 59

## 2013-02-10 ENCOUNTER — Ambulatory Visit (INDEPENDENT_AMBULATORY_CARE_PROVIDER_SITE_OTHER): Payer: 59 | Admitting: Internal Medicine

## 2013-02-10 VITALS — BP 100/70 | HR 68 | Temp 98.0°F | Resp 16 | Ht 67.0 in | Wt 187.5 lb

## 2013-02-10 DIAGNOSIS — N401 Enlarged prostate with lower urinary tract symptoms: Secondary | ICD-10-CM

## 2013-02-10 DIAGNOSIS — Z0000 Encounter for general adult medical examination without abnormal findings: Secondary | ICD-10-CM

## 2013-02-10 DIAGNOSIS — N138 Other obstructive and reflux uropathy: Secondary | ICD-10-CM

## 2013-02-10 DIAGNOSIS — K219 Gastro-esophageal reflux disease without esophagitis: Secondary | ICD-10-CM

## 2013-02-10 DIAGNOSIS — E291 Testicular hypofunction: Secondary | ICD-10-CM

## 2013-02-10 LAB — CBC WITH DIFFERENTIAL/PLATELET
Basophils Absolute: 0.1 10*3/uL (ref 0.0–0.1)
Eosinophils Absolute: 0.4 10*3/uL (ref 0.0–0.7)
Hemoglobin: 14.5 g/dL (ref 13.0–17.0)
Lymphocytes Relative: 32.2 % (ref 12.0–46.0)
Monocytes Relative: 7.2 % (ref 3.0–12.0)
Neutrophils Relative %: 53.8 % (ref 43.0–77.0)
Platelets: 236 10*3/uL (ref 150.0–400.0)
RDW: 13.3 % (ref 11.5–14.6)

## 2013-02-10 LAB — COMPREHENSIVE METABOLIC PANEL
ALT: 36 U/L (ref 0–53)
Albumin: 4 g/dL (ref 3.5–5.2)
CO2: 29 mEq/L (ref 19–32)
Calcium: 9.8 mg/dL (ref 8.4–10.5)
Chloride: 105 mEq/L (ref 96–112)
Creatinine, Ser: 1 mg/dL (ref 0.4–1.5)
GFR: 82.74 mL/min (ref >60.00)
Potassium: 4.3 mEq/L (ref 3.5–5.1)
Total Protein: 6.8 g/dL (ref 6.0–8.3)

## 2013-02-10 LAB — LIPID PANEL
Cholesterol: 203 mg/dL — ABNORMAL HIGH (ref 0–200)
Total CHOL/HDL Ratio: 6

## 2013-02-10 LAB — LDL CHOLESTEROL, DIRECT: Direct LDL: 139.4 mg/dL

## 2013-02-10 LAB — TSH: TSH: 1.13 u[IU]/mL (ref 0.35–5.50)

## 2013-02-10 MED ORDER — TESTOSTERONE MICRONIZED CRYS: CRYSTALS | Status: DC

## 2013-02-10 MED ORDER — TESTOSTERONE CYPIONATE 200 MG/ML IM SOLN
200.0000 mg | INTRAMUSCULAR | Status: DC
  Administered 2013-02-10: 200 mg via INTRAMUSCULAR

## 2013-02-10 NOTE — Assessment & Plan Note (Signed)
Exam done (GU/rectal not done at this request) Labs ordered Vaccines were reviewed Pt ed material was given

## 2013-02-10 NOTE — Assessment & Plan Note (Addendum)
I will check his T level and will look at his labs for complications as well

## 2013-02-10 NOTE — Assessment & Plan Note (Signed)
Prostate exam was not done at his request I will check his PSA today

## 2013-02-10 NOTE — Patient Instructions (Signed)
Testosterone topical solution What is this medicine? TESTOSTERONE (tes TOS ter one) is the main male hormone. It supports normal male traits such as muscle growth, facial hair, and deep voice. This medicine is used in males to treat low testosterone levels. This medicine may be used for other purposes; ask your health care provider or pharmacist if you have questions. What should I tell my health care provider before I take this medicine? They need to know if you have any of these conditions: -breast cancer -breathing problems while you sleep (sleep apnea) -diabetes -heart disease -if a male partner is pregnant or trying to get pregnant -kidney disease -liver disease -lung disease -prostate cancer, enlargement -an unusual or allergic reaction to testosterone, other medicines, foods, dyes, or preservatives -pregnant or trying to get pregnant -breast-feeding How should I use this medicine? This medicine is applied at the same time every day (preferably in the morning) to clean, dry, intact skin of the armpit. Follow the directions on the prescription label. If you take a bath or shower in the morning, apply the medicine after the bath or shower. If you use deodorants or antiperspirants, use them at least 2 minutes before applying this medicine. Only apply this medicine to the armpits. Do not use on any other body part. To use, remove the cap and the applicator cup from the pump. Fully depress the pump once to dispense solution into the applicator cup. With the applicator cup held upright, wipe the solution down and up into the armpit. Do not use your fingers or hand to rub the medicine into the skin. Allow the skin to dry a few minutes before putting on clothing. The skin solution is flammable. Avoid fire, flame, or smoking until the solution has dried. Wash your hands after use. Avoid bathing or swimming for at least 2 hours after you apply the medicine. A special MedGuide will be given to you  by the pharmacist with each prescription and refill. Be sure to read this information carefully each time. Talk to your pediatrician regarding the use of this medicine in children. Special care may be needed. Overdosage: If you think you've taken too much of this medicine contact a poison control center or emergency room at once. Overdosage: If you think you have taken too much of this medicine contact a poison control center or emergency room at once. NOTE: This medicine is only for you. Do not share this medicine with others. What if I miss a dose? If you miss a dose, use it as soon as you can. If it is almost time for your next dose, use only that dose. Do not use double or extra doses. What may interact with this medicine? -certain medicines for diabetes -certain medicines that treat or prevent blood clots like warfarin -oxyphenbutazone -propranolol -steroid medicines like prednisone or cortisone This list may not describe all possible interactions. Give your health care provider a list of all the medicines, herbs, non-prescription drugs, or dietary supplements you use. Also tell them if you smoke, drink alcohol, or use illegal drugs. Some items may interact with your medicine. What should I watch for while using this medicine? Visit your doctor or health care professional for regular checks on your progress. They will need to check the level of testosterone in your blood. This medicine can transfer from your body to others. If a person or pet comes in contact with the area where this medicine was applied to your skin, they may have a serious risk  of side effects. If you cannot avoid skin-to-skin contact with another person, make sure the site where this medicine was applied is covered with clothing. If accidental contact happens, the skin of the person or pet should be washed right away with soap and water. Also, a male partner who is pregnant or trying to get pregnant should avoid contact  with the gel or treated skin. This medicine may affect blood sugar levels. If you have diabetes, check with your doctor or health care professional before you change your diet or the dose of your diabetic medicine. This drug is banned from use in athletes by most athletic organizations. What side effects may I notice from receiving this medicine? Side effects that you should report to your doctor or health care professional as soon as possible: -allergic reactions like skin rash, itching or hives, swelling of the face, lips, or tongue -breast enlargement -breathing problems -changes in emotions or moods, especially anger, depression, or rage -dark urine -general ill feeling or flu-like symptoms -light-colored stools -loss of appetite, nausea -nausea, vomiting -pain, swelling, warmth in the leg -right upper belly pain -stomach pain -swelling of the ankles, feet, hands -too frequent or persistent erections -trouble passing urine or change in the amount of urine -unusually weak or tired -yellowing of the eyes or skinSide effects that usually do not require medical attention (Report these to your doctor or health care professional if they continue or are bothersome.): -acne -change in sex drive or performance -diarrhea -hair loss -headache This list may not describe all possible side effects. Call your doctor for medical advice about side effects. You may report side effects to FDA at 1-800-FDA-1088. Where should I keep my medicine? Keep out of the reach of children. This medicine can be abused. Keep your medicine in a safe place to protect it from theft. Do not share this medicine with anyone. Selling or giving away this medicine is dangerous and against the law. Store at room temperature between 15 to 30 degrees C (59 to 86 degrees F). Keep closed until use. Protect from heat and light. This medicine is flammable. Avoid exposure to heat, fire, flame, and smoking. Throw away any unused  medicine after the expiration date. NOTE: This sheet is a summary. It may not cover all possible information. If you have questions about this medicine, talk to your doctor, pharmacist, or health care provider.  2012, [employer]. (07/22/2009 10:17:45 AM)

## 2013-02-10 NOTE — Progress Notes (Signed)
  Subjective:    Patient ID: Darren Brady, male    DOB: 1965-12-07, 47 y.o.   MRN: [id number]  HPI Comments: He returns for a f/up on low T level.     Review of Systems  Constitutional: Positive for fatigue. Negative for fever, chills, diaphoresis, activity change, appetite change and unexpected weight change.  HENT: Negative.   Eyes: Negative.   Respiratory: Negative.  Negative for apnea, cough, choking, chest tightness, shortness of breath, wheezing and stridor.   Cardiovascular: Negative.  Negative for chest pain, palpitations and leg swelling.  Gastrointestinal: Negative.  Negative for nausea, abdominal pain, diarrhea and constipation.  Endocrine: Negative.   Genitourinary: Negative.   Musculoskeletal: Negative.   Skin: Negative.   Allergic/Immunologic: Negative.   Neurological: Negative.   Hematological: Negative.  Negative for adenopathy. Does not bruise/bleed easily.  Psychiatric/Behavioral: Negative.        Objective:   Physical Exam  Vitals reviewed. Constitutional: He is oriented to person, place, and time. He appears well-developed and well-nourished. No distress.  HENT:  Head: Normocephalic and atraumatic.  Mouth/Throat: Oropharynx is clear and moist. No oropharyngeal exudate.  Eyes: Conjunctivae are normal. Right eye exhibits no discharge. Left eye exhibits no discharge. No scleral icterus.  Neck: Normal range of motion. Neck supple. No JVD present. No tracheal deviation present. No thyromegaly present.  Cardiovascular: Normal rate, regular rhythm, normal heart sounds and intact distal pulses.  Exam reveals no gallop and no friction rub.   No murmur heard. Pulmonary/Chest: Effort normal and breath sounds normal. No stridor. No respiratory distress. He has no wheezes. He has no rales. He exhibits no tenderness.  Abdominal: Soft. Bowel sounds are normal. He exhibits no distension and no mass. There is no tenderness. There is no rebound and no guarding.   Genitourinary:  Exam refused by pt  Musculoskeletal: Normal range of motion. He exhibits no edema and no tenderness.  Lymphadenopathy:    He has no cervical adenopathy.  Neurological: He is oriented to person, place, and time.  Skin: Skin is warm and dry. No rash noted. He is not diaphoretic. No erythema. No pallor.  Psychiatric: He has a normal mood and affect. His behavior is normal. Judgment and thought content normal.     Lab Results  Component Value Date   WBC 7.1 06/12/2011   HGB 15.4 06/12/2011   HCT 44.9 06/12/2011   PLT 235.0 06/12/2011   GLUCOSE 85 06/12/2011   CHOL 182 07/12/2011   TRIG 117.0 07/12/2011   HDL 39.30 07/12/2011   LDLDIRECT 135.9 06/12/2011   LDLCALC 119* 07/12/2011   ALT 36 06/12/2011   AST 27 06/12/2011   NA 141 06/12/2011   K 4.1 06/12/2011   CL 104 06/12/2011   CREATININE 1.0 06/12/2011   BUN 20 06/12/2011   CO2 29 06/12/2011   TSH 0.69 06/12/2011   PSA 1.42 06/12/2011       Assessment & Plan:

## 2013-02-11 LAB — TESTOSTERONE, FREE, TOTAL, SHBG
Sex Hormone Binding: 19 nmol/L (ref 13–71)
Testosterone, Free: 68.8 pg/mL (ref 47.0–244.0)
Testosterone: 267 ng/dL — ABNORMAL LOW (ref 300–890)

## 2013-02-12 ENCOUNTER — Encounter: Payer: Self-pay | Admitting: Internal Medicine

## 2013-05-21 ENCOUNTER — Telehealth: Payer: Self-pay | Admitting: *Deleted

## 2013-05-21 NOTE — Telephone Encounter (Signed)
I need to check his level since he has started the testosterone injection

## 2013-05-21 NOTE — Telephone Encounter (Signed)
Pt called requesting testosterone refill, however he is requesting an increase due to his lab results on 6.23.14.  Please advise

## 2013-05-21 NOTE — Telephone Encounter (Signed)
Spoke with pts wife advised of MDs message.  States they will call back once they look at their schedule

## 2013-09-02 ENCOUNTER — Other Ambulatory Visit: Payer: Self-pay

## 2013-09-02 DIAGNOSIS — E291 Testicular hypofunction: Secondary | ICD-10-CM

## 2013-09-02 MED ORDER — TESTOSTERONE MICRONIZED CRYS: CRYSTALS | Status: DC

## 2013-12-17 ENCOUNTER — Encounter: Payer: Self-pay | Admitting: Internal Medicine

## 2013-12-17 ENCOUNTER — Ambulatory Visit (INDEPENDENT_AMBULATORY_CARE_PROVIDER_SITE_OTHER): Payer: 59 | Admitting: Internal Medicine

## 2013-12-17 VITALS — BP 120/86 | HR 78 | Temp 99.3°F | Resp 16 | Ht 67.0 in | Wt 184.0 lb

## 2013-12-17 DIAGNOSIS — L309 Dermatitis, unspecified: Secondary | ICD-10-CM

## 2013-12-17 DIAGNOSIS — L259 Unspecified contact dermatitis, unspecified cause: Secondary | ICD-10-CM

## 2013-12-17 DIAGNOSIS — E291 Testicular hypofunction: Secondary | ICD-10-CM

## 2013-12-17 MED ORDER — TESTOSTERONE CYPIONATE 200 MG/ML IM SOLN: 200.0000 mg | INTRAMUSCULAR | Status: DC

## 2013-12-17 MED ORDER — FLUOCINOLONE ACETONIDE 0.025 % EX CREA: TOPICAL_CREAM | Freq: Two times a day (BID) | CUTANEOUS | Status: DC

## 2013-12-17 MED ORDER — "SYRINGE/NEEDLE (DISP) 23G X 1"" 3 ML MISC": Status: DC

## 2013-12-17 MED ORDER — FLUOCINONIDE 0.05 % EX CREA: 1.0000 "application " | TOPICAL_CREAM | Freq: Two times a day (BID) | CUTANEOUS | Status: DC

## 2013-12-17 MED ORDER — "NEEDLE (DISP) 18G X 1-1/2"" MISC": Status: DC

## 2013-12-17 NOTE — Patient Instructions (Signed)
Testosterone This test is used to determine if your testosterone level is abnormal. This could be used to explain difficulty getting an erection (erectile dysfunction), inability of your partner to get pregnant (infertility), premature or delayed puberty if you are male, or the appearance of masculine physical features if you are male. PREPARATION FOR TEST A blood sample is obtained by inserting a needle into a vein in the arm. NORMAL FINDINGS  Free Testosterone: 0.3-2 pg/mL  % Free Testosterone: 0.1%-0.3% Total Testosterone:  7 mos-9 yrs (Tanner Stage I)  Male: Less than 30 ng/dL  Male: Less than 30 ng/dL  10-13 yrs (Tanner Stage II)  Male: Less than 300 ng/dL  Male: Less than 40 ng/dL  14-15 yrs (Tanner Stage III)  Male: 170-540 ng/dL  Male: Less than 60 ng/dL  16-19 yrs (Tanner Stage IV, V)  Male: 250-910 ng/dL  Male: Less than 70 ng/dL  20 yrs and over  Male: 280-1080 ng/dL  Male: Less than 70 ng/dL Ranges for normal findings may vary among different laboratories and hospitals. You should always check with your doctor after having lab work or other tests done to discuss the meaning of your test results and whether your values are considered within normal limits. MEANING OF TEST  Your caregiver will go over the test results with you and discuss the importance and meaning of your results, as well as treatment options and the need for additional tests if necessary. OBTAINING THE TEST RESULTS It is your responsibility to obtain your test results. Ask the lab or department performing the test when and how you will get your results. Document Released: 08/24/2004 Document Revised: 10/30/2011 Document Reviewed: 07/19/2008 [employer] Patient Information 2014 [employer].  

## 2013-12-17 NOTE — Progress Notes (Signed)
   Subjective:    Patient ID: Darren Brady, male    DOB: [birth year], 48 y.o.   MRN: [id number]  HPI Comments: He has been out of the testosterone supplement for several weeks and now complains of fatigue, irritability, low libido, ED, and lethargy.     Review of Systems  Constitutional: Positive for fatigue. Negative for fever, chills, diaphoresis and appetite change.  HENT: Negative.   Eyes: Negative.   Respiratory: Negative.  Negative for cough, choking, chest tightness, shortness of breath, wheezing and stridor.   Cardiovascular: Negative.  Negative for chest pain, palpitations and leg swelling.  Gastrointestinal: Negative for abdominal pain.  Endocrine: Negative.   Genitourinary: Negative.  Negative for difficulty urinating.  Skin: Positive for rash. Negative for color change, pallor and wound.       He has had a rash and itching on his hands for several years, it recurs intermittently and he applies topical steroids and it resolves, he is requesting a refill today.  Allergic/Immunologic: Negative.   Neurological: Negative.  Negative for dizziness.  Hematological: Negative.  Negative for adenopathy.  Psychiatric/Behavioral: Negative.        Objective:   Physical Exam  Vitals reviewed. Constitutional: He is oriented to person, place, and time. He appears well-developed and well-nourished. No distress.  HENT:  Head: Normocephalic and atraumatic.  Mouth/Throat: Oropharynx is clear and moist. No oropharyngeal exudate.  Eyes: Conjunctivae are normal. Right eye exhibits no discharge. Left eye exhibits no discharge. No scleral icterus.  Neck: Normal range of motion. Neck supple. No JVD present. No tracheal deviation present. No thyromegaly present.  Cardiovascular: Normal rate, regular rhythm, normal heart sounds and intact distal pulses.  Exam reveals no gallop and no friction rub.   No murmur heard. Pulmonary/Chest: Effort normal and breath sounds normal. No stridor. No  respiratory distress. He has no wheezes. He has no rales. He exhibits no tenderness.  Abdominal: Soft. Bowel sounds are normal. He exhibits no distension and no mass. There is no tenderness. There is no rebound and no guarding.  Musculoskeletal: Normal range of motion. He exhibits no edema and no tenderness.  Lymphadenopathy:    He has no cervical adenopathy.  Neurological: He is oriented to person, place, and time.  Skin: Skin is warm and dry. No rash noted. He is not diaphoretic. No erythema. No pallor.  He has mild xerosis and scaling on his hands  Psychiatric: He has a normal mood and affect. His behavior is normal. Judgment and thought content normal.    Lab Results  Component Value Date   WBC 6.7 02/10/2013   HGB 14.5 02/10/2013   HCT 42.4 02/10/2013   PLT 236.0 02/10/2013   GLUCOSE 98 02/10/2013   CHOL 203* 02/10/2013   TRIG 274.0* 02/10/2013   HDL 36.20* 02/10/2013   LDLDIRECT 139.4 02/10/2013   LDLCALC 119* 07/12/2011   ALT 36 02/10/2013   AST 20 02/10/2013   NA 141 02/10/2013   K 4.3 02/10/2013   CL 105 02/10/2013   CREATININE 1.0 02/10/2013   BUN 17 02/10/2013   CO2 29 02/10/2013   TSH 1.13 02/10/2013   PSA 1.41 02/10/2013        Assessment & Plan:

## 2013-12-17 NOTE — Assessment & Plan Note (Signed)
He wants to change to injectable testosterone

## 2013-12-17 NOTE — Progress Notes (Signed)
Pre visit review using our clinic review tool, if applicable. No additional management support is needed unless otherwise documented below in the visit note. 

## 2013-12-17 NOTE — Assessment & Plan Note (Addendum)
Cont fluocinolide cream as needed

## 2013-12-18 ENCOUNTER — Ambulatory Visit (INDEPENDENT_AMBULATORY_CARE_PROVIDER_SITE_OTHER): Payer: 59 | Admitting: Psychology

## 2013-12-18 DIAGNOSIS — F411 Generalized anxiety disorder: Secondary | ICD-10-CM

## 2014-01-02 ENCOUNTER — Ambulatory Visit (INDEPENDENT_AMBULATORY_CARE_PROVIDER_SITE_OTHER): Payer: 59 | Admitting: Psychology

## 2014-01-02 DIAGNOSIS — F411 Generalized anxiety disorder: Secondary | ICD-10-CM

## 2014-01-27 ENCOUNTER — Ambulatory Visit (INDEPENDENT_AMBULATORY_CARE_PROVIDER_SITE_OTHER): Payer: 59 | Admitting: *Deleted

## 2014-01-27 DIAGNOSIS — E291 Testicular hypofunction: Secondary | ICD-10-CM

## 2014-01-27 MED ORDER — TESTOSTERONE CYPIONATE 200 MG/ML IM SOLN
200.0000 mg | Freq: Once | INTRAMUSCULAR | Status: AC
  Administered 2014-01-27: 200 mg via INTRAMUSCULAR

## 2015-01-24 ENCOUNTER — Emergency Department (HOSPITAL_COMMUNITY)
Admission: EM | Admit: 2015-01-24 | Discharge: 2015-01-24 | Disposition: A | Discharge status: Home / Self Care | Payer: 59 | Attending: Emergency Medicine | Admitting: Emergency Medicine

## 2015-01-24 ENCOUNTER — Encounter (HOSPITAL_COMMUNITY): Payer: Self-pay | Admitting: Emergency Medicine

## 2015-01-24 DIAGNOSIS — R6883 Chills (without fever): Secondary | ICD-10-CM | POA: Insufficient documentation

## 2015-01-24 DIAGNOSIS — E230 Hypopituitarism: Secondary | ICD-10-CM | POA: Insufficient documentation

## 2015-01-24 DIAGNOSIS — R1013 Epigastric pain: Secondary | ICD-10-CM | POA: Diagnosis not present

## 2015-01-24 DIAGNOSIS — Z79899 Other long term (current) drug therapy: Secondary | ICD-10-CM | POA: Diagnosis not present

## 2015-01-24 DIAGNOSIS — R112 Nausea with vomiting, unspecified: Secondary | ICD-10-CM | POA: Insufficient documentation

## 2015-01-24 DIAGNOSIS — R197 Diarrhea, unspecified: Secondary | ICD-10-CM | POA: Insufficient documentation

## 2015-01-24 DIAGNOSIS — R109 Unspecified abdominal pain: Secondary | ICD-10-CM | POA: Diagnosis present

## 2015-01-24 DIAGNOSIS — Z8719 Personal history of other diseases of the digestive system: Secondary | ICD-10-CM | POA: Insufficient documentation

## 2015-01-24 DIAGNOSIS — M791 Myalgia: Secondary | ICD-10-CM | POA: Diagnosis not present

## 2015-01-24 LAB — CBC WITH DIFFERENTIAL/PLATELET
BASOS PCT: 0 % (ref 0–1)
Basophils Absolute: 0 10*3/uL (ref 0.0–0.1)
Eosinophils Absolute: 0.1 10*3/uL (ref 0.0–0.7)
Eosinophils Relative: 1 % (ref 0–5)
HEMATOCRIT: 47.7 % (ref 39.0–52.0)
HEMOGLOBIN: 16.1 g/dL (ref 13.0–17.0)
Lymphocytes Relative: 2 % — ABNORMAL LOW (ref 12–46)
Lymphs Abs: 0.3 10*3/uL — ABNORMAL LOW (ref 0.7–4.0)
MCH: 28.4 pg (ref 26.0–34.0)
MCHC: 33.8 g/dL (ref 30.0–36.0)
MCV: 84.3 fL (ref 78.0–100.0)
MONO ABS: 0.6 10*3/uL (ref 0.1–1.0)
Monocytes Relative: 5 % (ref 3–12)
NEUTROS ABS: 12.2 10*3/uL — AB (ref 1.7–7.7)
Neutrophils Relative %: 92 % — ABNORMAL HIGH (ref 43–77)
Platelets: 207 10*3/uL (ref 150–400)
RBC: 5.66 MIL/uL (ref 4.22–5.81)
RDW: 12.8 % (ref 11.5–15.5)
WBC: 13.2 10*3/uL — AB (ref 4.0–10.5)

## 2015-01-24 LAB — COMPREHENSIVE METABOLIC PANEL
ALT: 36 U/L (ref 17–63)
ANION GAP: 11 (ref 5–15)
AST: 26 U/L (ref 15–41)
Albumin: 4.3 g/dL (ref 3.5–5.0)
Alkaline Phosphatase: 65 U/L (ref 38–126)
BUN: 26 mg/dL — AB (ref 6–20)
CO2: 23 mmol/L (ref 22–32)
Calcium: 9.6 mg/dL (ref 8.9–10.3)
Chloride: 106 mmol/L (ref 101–111)
Creatinine, Ser: 1.07 mg/dL (ref 0.61–1.24)
GFR calc Af Amer: >60 mL/min (ref >60)
Glucose, Bld: 102 mg/dL — ABNORMAL HIGH (ref 65–99)
Potassium: 4.2 mmol/L (ref 3.5–5.1)
Sodium: 140 mmol/L (ref 135–145)
Total Bilirubin: 0.9 mg/dL (ref 0.3–1.2)
Total Protein: 7.2 g/dL (ref 6.5–8.1)

## 2015-01-24 LAB — LIPASE, BLOOD: Lipase: 28 U/L (ref 22–51)

## 2015-01-24 MED ORDER — MORPHINE SULFATE 4 MG/ML IJ SOLN
4.0000 mg | Freq: Once | INTRAMUSCULAR | Status: AC
Start: 2015-01-24 — End: 2015-01-24
  Administered 2015-01-24: 4 mg via INTRAVENOUS
  Filled 2015-01-24: qty 1

## 2015-01-24 MED ORDER — ONDANSETRON HCL 4 MG/2ML IJ SOLN
4.0000 mg | Freq: Once | INTRAMUSCULAR | Status: AC
Start: 2015-01-24 — End: 2015-01-24
  Administered 2015-01-24: 4 mg via INTRAVENOUS
  Filled 2015-01-24: qty 2

## 2015-01-24 MED ORDER — DICYCLOMINE HCL 10 MG PO CAPS
10.0000 mg | ORAL_CAPSULE | Freq: Once | ORAL | Status: AC
  Administered 2015-01-24: 10 mg via ORAL
  Filled 2015-01-24: qty 1

## 2015-01-24 MED ORDER — DIPHENOXYLATE-ATROPINE 2.5-0.025 MG PO TABS
2.0000 | ORAL_TABLET | Freq: Once | ORAL | Status: AC
  Administered 2015-01-24: 2 via ORAL
  Filled 2015-01-24: qty 2

## 2015-01-24 MED ORDER — DICYCLOMINE HCL 20 MG PO TABS: 20.0000 mg | ORAL_TABLET | Freq: Two times a day (BID) | ORAL | Status: DC

## 2015-01-24 MED ORDER — ONDANSETRON HCL 4 MG PO TABS: 4.0000 mg | ORAL_TABLET | Freq: Four times a day (QID) | ORAL | Status: DC

## 2015-01-24 MED ORDER — DIPHENOXYLATE-ATROPINE 2.5-0.025 MG PO TABS: 2.0000 | ORAL_TABLET | Freq: Four times a day (QID) | ORAL | Status: DC | PRN

## 2015-01-24 MED ORDER — SODIUM CHLORIDE 0.9 % IV BOLUS (SEPSIS)
1000.0000 mL | Freq: Once | INTRAVENOUS | Status: AC
  Administered 2015-01-24: 1000 mL via INTRAVENOUS

## 2015-01-24 NOTE — Discharge Instructions (Signed)
Follow-up with your doctor tomorrow for a recheck if not improving. Return to the ER if symptoms worsen at any time.  Abdominal Pain Many things can cause abdominal pain. Usually, abdominal pain is not caused by a disease and will improve without treatment. It can often be observed and treated at home. Your health care provider will do a physical exam and possibly order blood tests and X-rays to help determine the seriousness of your pain. However, in many cases, more time must pass before a clear cause of the pain can be found. Before that point, your health care provider may not know if you need more testing or further treatment. HOME CARE INSTRUCTIONS  Monitor your abdominal pain for any changes. The following actions may help to alleviate any discomfort you are experiencing:  Only take over-the-counter or prescription medicines as directed by your health care provider.  Do not take laxatives unless directed to do so by your health care provider.  Try a clear liquid diet (broth, tea, or water) as directed by your health care provider. Slowly move to a bland diet as tolerated. SEEK MEDICAL CARE IF:  You have unexplained abdominal pain.  You have abdominal pain associated with nausea or diarrhea.  You have pain when you urinate or have a bowel movement.  You experience abdominal pain that wakes you in the night.  You have abdominal pain that is worsened or improved by eating food.  You have abdominal pain that is worsened with eating fatty foods.  You have a fever. SEEK IMMEDIATE MEDICAL CARE IF:   Your pain does not go away within 2 hours.  You keep throwing up (vomiting).  Your pain is felt only in portions of the abdomen, such as the right side or the left lower portion of the abdomen.  You pass bloody or black tarry stools. MAKE SURE YOU:  Understand these instructions.   Will watch your condition.   Will get help right away if you are not doing well or get worse.   Document Released: 05/17/2005 Document Revised: 08/12/2013 Document Reviewed: 04/16/2013 [facility] Patient Information 2015 [place], [place]. This information is not intended to replace advice given to you by your health care provider. Make sure you discuss any questions you have with your health care provider.  Diarrhea Diarrhea is frequent loose and watery bowel movements. It can cause you to feel weak and dehydrated. Dehydration can cause you to become tired and thirsty, have a dry mouth, and have decreased urination that often is dark yellow. Diarrhea is a sign of another problem, most often an infection that will not last long. In most cases, diarrhea typically lasts 2-3 days. However, it can last longer if it is a sign of something more serious. It is important to treat your diarrhea as directed by your caregiver to lessen or prevent future episodes of diarrhea. CAUSES  Some common causes include:  Gastrointestinal infections caused by viruses, bacteria, or parasites.  Food poisoning or food allergies.  Certain medicines, such as antibiotics, chemotherapy, and laxatives.  Artificial sweeteners and fructose.  Digestive disorders. HOME CARE INSTRUCTIONS  Ensure adequate fluid intake (hydration): Have 1 cup (8 oz) of fluid for each diarrhea episode. Avoid fluids that contain simple sugars or sports drinks, fruit juices, whole milk products, and sodas. Your urine should be clear or pale yellow if you are drinking enough fluids. Hydrate with an oral rehydration solution that you can purchase at pharmacies, retail stores, and online. You can prepare an oral  rehydration solution at home by mixing the following ingredients together:   - tsp table salt.   tsp baking soda.   tsp salt substitute containing potassium chloride.  1  tablespoons sugar.  1 L (34 oz) of water.  Certain foods and beverages may increase the speed at which food moves through the gastrointestinal (GI) tract. These  foods and beverages should be avoided and include:  Caffeinated and alcoholic beverages.  High-fiber foods, such as raw fruits and vegetables, nuts, seeds, and whole grain breads and cereals.  Foods and beverages sweetened with sugar alcohols, such as xylitol, sorbitol, and mannitol.  Some foods may be well tolerated and may help thicken stool including:  Starchy foods, such as rice, toast, pasta, low-sugar cereal, oatmeal, grits, baked potatoes, crackers, and bagels.  Bananas.  Applesauce.  Add probiotic-rich foods to help increase healthy bacteria in the GI tract, such as yogurt and fermented milk products.  Wash your hands well after each diarrhea episode.  Only take over-the-counter or prescription medicines as directed by your caregiver.  Take a warm bath to relieve any burning or pain from frequent diarrhea episodes. SEEK IMMEDIATE MEDICAL CARE IF:   You are unable to keep fluids down.  You have persistent vomiting.  You have blood in your stool, or your stools are black and tarry.  You do not urinate in 6-8 hours, or there is only a small amount of very dark urine.  You have abdominal pain that increases or localizes.  You have weakness, dizziness, confusion, or light-headedness.  You have a severe headache.  Your diarrhea gets worse or does not get better.  You have a fever or persistent symptoms for more than 2-3 days.  You have a fever and your symptoms suddenly get worse. MAKE SURE YOU:   Understand these instructions.  Will watch your condition.  Will get help right away if you are not doing well or get worse. Document Released: 07/28/2002 Document Revised: 12/22/2013 Document Reviewed: 04/14/2012 [facility] Patient Information 2015 [place], [place]. This information is not intended to replace advice given to you by your health care provider. Make sure you discuss any questions you have with your health care provider.  Nausea and Vomiting Nausea is a  sick feeling that often comes before throwing up (vomiting). Vomiting is a reflex where stomach contents come out of your mouth. Vomiting can cause severe loss of body fluids (dehydration). Children and elderly adults can become dehydrated quickly, especially if they also have diarrhea. Nausea and vomiting are symptoms of a condition or disease. It is important to find the cause of your symptoms. CAUSES   Direct irritation of the stomach lining. This irritation can result from increased acid production (gastroesophageal reflux disease), infection, food poisoning, taking certain medicines (such as nonsteroidal anti-inflammatory drugs), alcohol use, or tobacco use.  Signals from the brain.These signals could be caused by a headache, heat exposure, an inner ear disturbance, increased pressure in the brain from injury, infection, a tumor, or a concussion, pain, emotional stimulus, or metabolic problems.  An obstruction in the gastrointestinal tract (bowel obstruction).  Illnesses such as diabetes, hepatitis, gallbladder problems, appendicitis, kidney problems, cancer, sepsis, atypical symptoms of a heart attack, or eating disorders.  Medical treatments such as chemotherapy and radiation.  Receiving medicine that makes you sleep (general anesthetic) during surgery. DIAGNOSIS Your caregiver may ask for tests to be done if the problems do not improve after a few days. Tests may also be done if symptoms are severe  or if the reason for the nausea and vomiting is not clear. Tests may include:  Urine tests.  Blood tests.  Stool tests.  Cultures (to look for evidence of infection).  X-rays or other imaging studies. Test results can help your caregiver make decisions about treatment or the need for additional tests. TREATMENT You need to stay well hydrated. Drink frequently but in small amounts.You may wish to drink water, sports drinks, clear broth, or eat frozen ice pops or gelatin dessert to help  stay hydrated.When you eat, eating slowly may help prevent nausea.There are also some antinausea medicines that may help prevent nausea. HOME CARE INSTRUCTIONS   Take all medicine as directed by your caregiver.  If you do not have an appetite, do not force yourself to eat. However, you must continue to drink fluids.  If you have an appetite, eat a normal diet unless your caregiver tells you differently.  Eat a variety of complex carbohydrates (rice, wheat, potatoes, bread), lean meats, yogurt, fruits, and vegetables.  Avoid high-fat foods because they are more difficult to digest.  Drink enough water and fluids to keep your urine clear or pale yellow.  If you are dehydrated, ask your caregiver for specific rehydration instructions. Signs of dehydration may include:  Severe thirst.  Dry lips and mouth.  Dizziness.  Dark urine.  Decreasing urine frequency and amount.  Confusion.  Rapid breathing or pulse. SEEK IMMEDIATE MEDICAL CARE IF:   You have blood or brown flecks (like coffee grounds) in your vomit.  You have black or bloody stools.  You have a severe headache or stiff neck.  You are confused.  You have severe abdominal pain.  You have chest pain or trouble breathing.  You do not urinate at least once every 8 hours.  You develop cold or clammy skin.  You continue to vomit for longer than 24 to 48 hours.  You have a fever. MAKE SURE YOU:   Understand these instructions.  Will watch your condition.  Will get help right away if you are not doing well or get worse. Document Released: 08/07/2005 Document Revised: 10/30/2011 Document Reviewed: 01/04/2011 [facility] Patient Information 2015 [place], [place]. This information is not intended to replace advice given to you by your health care provider. Make sure you discuss any questions you have with your health care provider.

## 2015-01-24 NOTE — ED Provider Notes (Signed)
CSN: [id number]     Arrival date & time 01/24/15  1145 History  This chart was scribed for Orpah Greek, MD by Hansel Feinstein, ED Scribe. This patient was seen in room APA01/APA01 and the patient's care was started at 11:58 AM.     Chief Complaint  Patient presents with  . Abdominal Pain   The history is provided by the patient. No language interpreter was used.    HPI Comments: Darren Brady is a 49 y.o. male who presents to the Emergency Department complaining of constant, moderate abdominal pain onset this morning at 0630. He reports chills, multiple episodes of vomiting, cramping, multiple episodes of diarrhea, and nausea as associated Sx. He denies sick contact. He also denies fever, cough and congestion.  Past Medical History  Diagnosis Date  . GERD (gastroesophageal reflux disease)   . Hypogonadism male    History reviewed. No pertinent past surgical history. Family History  Problem Relation Age of Onset  . Hyperlipidemia Other   . Hypertension Other   . Kidney disease Other   . Thyroid disease Other   . Coronary artery disease Other   . Cancer Neg Hx    History  Substance Use Topics  . Smoking status: Never Smoker   . Smokeless tobacco: Not on file  . Alcohol Use: No    Review of Systems  Constitutional: Positive for chills. Negative for fever.  HENT: Negative for congestion.   Respiratory: Negative for cough.   Gastrointestinal: Positive for nausea, vomiting, abdominal pain and diarrhea.  Musculoskeletal: Positive for myalgias.  All other systems reviewed and are negative.  Allergies  Review of patient's allergies indicates no known allergies.  Home Medications   Prior to Admission medications   Medication Sig Start Date End Date Taking? Authorizing Provider  fluocinonide cream (LIDEX) 9.21 % Apply 1 application topically 2 (two) times daily. 12/17/13   Janith Lima, MD  NEEDLE, DISP, 18 G (BD DISP NEEDLES) 18G X 1-1/2" MISC Use once every two weeks  12/17/13   Janith Lima, MD  SYRINGE-NEEDLE, DISP, 3 ML (BD ECLIPSE SYRINGE) 23G X 1" 3 ML MISC Use once every two weeks 12/17/13   Janith Lima, MD  testosterone cypionate (DEPOTESTOTERONE CYPIONATE) 200 MG/ML injection Inject 1 mL (200 mg total) into the muscle every 14 (fourteen) days. 12/17/13   Janith Lima, MD   BP 103/70 mmHg  Pulse 102  Temp(Src) 99 F (37.2 C) (Oral)  Resp 18  Ht 5\' 7"  (1.702 m)  Wt 180 lb (81.647 kg)  BMI 28.19 kg/m2  SpO2 99% Physical Exam  Constitutional: He is oriented to person, place, and time. He appears well-developed and well-nourished. No distress.  HENT:  Head: Normocephalic and atraumatic.  Right Ear: Hearing normal.  Left Ear: Hearing normal.  Nose: Nose normal.  Mouth/Throat: Oropharynx is clear and moist and mucous membranes are normal.  Eyes: Conjunctivae and EOM are normal. Pupils are equal, round, and reactive to light.  Neck: Normal range of motion. Neck supple.  Cardiovascular: Regular rhythm, S1 normal and S2 normal.  Exam reveals no gallop and no friction rub.   No murmur heard. Pulmonary/Chest: Effort normal and breath sounds normal. No respiratory distress. He exhibits no tenderness.  Abdominal: Soft. Normal appearance and bowel sounds are normal. There is no hepatosplenomegaly. There is tenderness ( epigastric). There is no rebound, no guarding, no tenderness at McBurney's point and negative Murphy's sign. No hernia.  Musculoskeletal: Normal range of motion.  Neurological: He is alert and oriented to person, place, and time. He has normal strength. No cranial nerve deficit or sensory deficit. Coordination normal. GCS eye subscore is 4. GCS verbal subscore is 5. GCS motor subscore is 6.  Skin: Skin is warm, dry and intact. No rash noted. No cyanosis.  Psychiatric: He has a normal mood and affect. His speech is normal and behavior is normal. Thought content normal.  Nursing note and vitals reviewed.   ED Course  Procedures  (including critical care time) DIAGNOSTIC STUDIES: Oxygen Saturation is 99% on RA, normal by my interpretation.    COORDINATION OF CARE: 12:01 PM Discussed treatment plan with pt at bedside and pt agreed to plan.   Labs Review Labs Reviewed - No data to display  Imaging Review No results found.   EKG Interpretation None     MDM   Final diagnoses:  None   abdominal pain  Vomiting  Diarrhea  Patient presents to the ER for evaluation of nausea, vomiting and diarrhea which began earlier this morning. He is complaining of pain in the center of his abdomen. He did have mild tenderness there but there was no guarding or rebound. Patient's blood work was unremarkable other than nonspecific white blood cell count of 13.2 and slightly elevated BUN with normal creatinine. Patient was hydrated with a liter of normal saline solution. He was administered antiemetics, anti-spasmodics, antidiarrhea dose, analgesia. He has significantly improved. Repeat examination revealed that he is comfortable without any pain. I did discuss with he and his wife the need for possible repeat evaluation. I understand that I have not performed a CT scan because he does not have any current indications, but that if he does not improve in 24 hours should be rechecked. They're also counseled that he should return later today or anytime if he significantly worsens.  I personally performed the services described in this documentation, which was scribed in my presence. The recorded information has been reviewed and is accurate.    Orpah Greek, MD 01/24/15 1258

## 2015-01-24 NOTE — ED Notes (Signed)
Pt states that he has been having n/v/d since this morning with abdominal pain.

## 2018-11-28 ENCOUNTER — Other Ambulatory Visit: Payer: Self-pay | Admitting: Urology

## 2018-11-28 ENCOUNTER — Encounter (HOSPITAL_COMMUNITY): Payer: Self-pay | Admitting: *Deleted

## 2018-11-28 ENCOUNTER — Other Ambulatory Visit: Payer: Self-pay

## 2018-11-28 NOTE — Progress Notes (Signed)
Anesthesia Chart Review   Case:  [id number] Date/Time:  12/02/18 1129   Procedure:  TRANSURETHRAL RESECTION OF BLADDER TUMOR (TURBT) (N/A ) - 30 MINS   Anesthesia type:  General   Pre-op diagnosis:  BLADDER TUMOR   Location:  [place] [facility]   Surgeon:  Cleon Gustin, MD      DISCUSSION: 53 yo never smoker with h/o GERD, bladder tumor scheduled for above procedure 12/02/18 with Dr. Nicolette Bang.   Pt can proceed with planned procedure barring acute status change and after evaluation DOS (same day workup).  VS: Ht 5\' 7"  (1.702 m)   Wt 86.2 kg   BMI 29.76 kg/m   PROVIDERS: No primary care provider on file.   LABS: Labs DOS (all labs ordered are listed, but only abnormal results are displayed)  Labs Reviewed - No data to display   IMAGES:   EKG:   CV:  Past Medical History:  Diagnosis Date  . Bladder tumor   . GERD (gastroesophageal reflux disease)    occasional after taking flomax  . Hypogonadism male   . Nocturia   . Seasonal allergies   . Wears glasses     Past Surgical History:  Procedure Laterality Date  . CARDIAC CATHETERIZATION  11-21-2002  dr w. Albertine Patricia  @[facility]    normal coronary arteries,  ef 60%  . NO PAST SURGERIES      MEDICATIONS: No current facility-administered medications for this encounter.    . Ibuprofen-diphenhydrAMINE Cit (ADVIL PM) 200-38 MG TABS  . tamsulosin (FLOMAX) 0.4 MG CAPS capsule  . Testosterone 20.25 MG/ACT (1.62%) GEL    [facility] Pre-Surgical Testing [phone] 11/28/18 4:53 PM

## 2018-11-28 NOTE — Progress Notes (Signed)
Spoke w/ pt via phone for pre-op interview for surgery at [facility] main on 03-03-2019.  Pt verbalized pre-op instructions (sheet in chart).     SPOKE W/  _  Pt via phone     SCREENING SYMPTOMS OF COVID 19:   COUGH--  NO  RUNNY NOSE---  NO  SORE THROAT--- NO  NASAL CONGESTION---- NO  SNEEZING---- NO  SHORTNESS OF BREATH--- NO  DIFFICULTY BREATHING--- NO  TEMP >100.4----- NO  UNEXPLAINED BODY ACHES------ NO   HAVE YOU OR ANY FAMILY MEMBER TRAVELLED PAST 14 DAYS OUT OF THE   COUNTY--- NO (pt lives [place] but has not been to any other county other [place] for doctor appointment  STATE---- NO COUNTRY---- No  HAVE YOU OR ANY FAMILY MEMBER BEEN EXPOSED TO ANYONE WITH COVID 19?  NO

## 2018-12-02 ENCOUNTER — Encounter (HOSPITAL_COMMUNITY)
Admission: RE | Disposition: A | Discharge status: Home / Self Care | Payer: Self-pay | Source: Home / Self Care | Attending: Urology

## 2018-12-02 ENCOUNTER — Ambulatory Visit (HOSPITAL_COMMUNITY): Payer: BLUE CROSS/BLUE SHIELD | Admitting: Physician Assistant

## 2018-12-02 ENCOUNTER — Encounter (HOSPITAL_COMMUNITY): Payer: Self-pay | Admitting: Anesthesiology

## 2018-12-02 ENCOUNTER — Ambulatory Visit (HOSPITAL_COMMUNITY)
Admission: RE | Admit: 2018-12-02 | Discharge: 2018-12-02 | Disposition: A | Discharge status: Home / Self Care | Payer: BLUE CROSS/BLUE SHIELD | Attending: Urology | Admitting: Urology

## 2018-12-02 DIAGNOSIS — R310 Gross hematuria: Secondary | ICD-10-CM | POA: Diagnosis present

## 2018-12-02 DIAGNOSIS — C672 Malignant neoplasm of lateral wall of bladder: Secondary | ICD-10-CM | POA: Insufficient documentation

## 2018-12-02 DIAGNOSIS — C670 Malignant neoplasm of trigone of bladder: Secondary | ICD-10-CM | POA: Insufficient documentation

## 2018-12-02 DIAGNOSIS — D494 Neoplasm of unspecified behavior of bladder: Secondary | ICD-10-CM

## 2018-12-02 DIAGNOSIS — Z79899 Other long term (current) drug therapy: Secondary | ICD-10-CM | POA: Diagnosis not present

## 2018-12-02 HISTORY — DX: Neoplasm of unspecified behavior of bladder: D49.4

## 2018-12-02 HISTORY — PX: TRANSURETHRAL RESECTION OF BLADDER TUMOR: SHX2575

## 2018-12-02 HISTORY — DX: Presence of spectacles and contact lenses: Z97.3

## 2018-12-02 HISTORY — DX: Nocturia: R35.1

## 2018-12-02 HISTORY — DX: Other seasonal allergic rhinitis: J30.2

## 2018-12-02 LAB — CBC WITH DIFFERENTIAL/PLATELET
Abs Immature Granulocytes: 0.04 10*3/uL (ref 0.00–0.07)
Basophils Absolute: 0.1 10*3/uL (ref 0.0–0.1)
Basophils Relative: 1 %
Eosinophils Absolute: 0.2 10*3/uL (ref 0.0–0.5)
Eosinophils Relative: 3 %
HCT: 48.6 % (ref 39.0–52.0)
Hemoglobin: 15.8 g/dL (ref 13.0–17.0)
Immature Granulocytes: 1 %
Lymphocytes Relative: 22 %
Lymphs Abs: 1.6 10*3/uL (ref 0.7–4.0)
MCH: 27.9 pg (ref 26.0–34.0)
MCHC: 32.5 g/dL (ref 30.0–36.0)
MCV: 85.9 fL (ref 80.0–100.0)
Monocytes Absolute: 0.6 10*3/uL (ref 0.1–1.0)
Monocytes Relative: 9 %
Neutro Abs: 4.7 10*3/uL (ref 1.7–7.7)
Neutrophils Relative %: 64 %
Platelets: 253 10*3/uL (ref 150–400)
RBC: 5.66 MIL/uL (ref 4.22–5.81)
RDW: 12.9 % (ref 11.5–15.5)
WBC: 7.3 10*3/uL (ref 4.0–10.5)
nRBC: 0 % (ref 0.0–0.2)

## 2018-12-02 LAB — BASIC METABOLIC PANEL
Anion gap: 11 (ref 5–15)
BUN: 20 mg/dL (ref 6–20)
CO2: 23 mmol/L (ref 22–32)
Calcium: 9.7 mg/dL (ref 8.9–10.3)
Chloride: 106 mmol/L (ref 98–111)
Creatinine, Ser: 1.51 mg/dL — ABNORMAL HIGH (ref 0.61–1.24)
GFR calc Af Amer: 60 mL/min — ABNORMAL LOW (ref >60)
GFR calc non Af Amer: 52 mL/min — ABNORMAL LOW (ref >60)
Glucose, Bld: 105 mg/dL — ABNORMAL HIGH (ref 70–99)
Potassium: 4.1 mmol/L (ref 3.5–5.1)
Sodium: 140 mmol/L (ref 135–145)

## 2018-12-02 SURGERY — TURBT (TRANSURETHRAL RESECTION OF BLADDER TUMOR)
Anesthesia: General

## 2018-12-02 MED ORDER — ACETAMINOPHEN 325 MG PO TABS: 325.0000 mg | ORAL_TABLET | Freq: Once | ORAL | Status: DC

## 2018-12-02 MED ORDER — MIDAZOLAM HCL 2 MG/2ML IJ SOLN
INTRAMUSCULAR | Status: AC
  Filled 2018-12-02: qty 2

## 2018-12-02 MED ORDER — SUCCINYLCHOLINE CHLORIDE 200 MG/10ML IV SOSY
PREFILLED_SYRINGE | INTRAVENOUS | Status: DC | PRN
  Administered 2018-12-02: 120 mg via INTRAVENOUS

## 2018-12-02 MED ORDER — FENTANYL CITRATE (PF) 250 MCG/5ML IJ SOLN
INTRAMUSCULAR | Status: DC | PRN
  Administered 2018-12-02: 100 ug via INTRAVENOUS
  Administered 2018-12-02 (×2): 50 ug via INTRAVENOUS

## 2018-12-02 MED ORDER — HYDROMORPHONE HCL 1 MG/ML IJ SOLN: 0.2500 mg | INTRAMUSCULAR | Status: DC | PRN

## 2018-12-02 MED ORDER — OXYCODONE-ACETAMINOPHEN 5-325 MG PO TABS: 1.0000 | ORAL_TABLET | ORAL | 0 refills | Status: DC | PRN

## 2018-12-02 MED ORDER — SUCCINYLCHOLINE CHLORIDE 200 MG/10ML IV SOSY
PREFILLED_SYRINGE | INTRAVENOUS | Status: AC
  Filled 2018-12-02: qty 10

## 2018-12-02 MED ORDER — ROCURONIUM BROMIDE 10 MG/ML (PF) SYRINGE
PREFILLED_SYRINGE | INTRAVENOUS | Status: AC
  Filled 2018-12-02: qty 10

## 2018-12-02 MED ORDER — ONDANSETRON HCL 4 MG/2ML IJ SOLN
INTRAMUSCULAR | Status: AC
  Filled 2018-12-02: qty 2

## 2018-12-02 MED ORDER — LIDOCAINE 2% (20 MG/ML) 5 ML SYRINGE
INTRAMUSCULAR | Status: AC
  Filled 2018-12-02: qty 5

## 2018-12-02 MED ORDER — CEFAZOLIN SODIUM-DEXTROSE 2-4 GM/100ML-% IV SOLN
2.0000 g | INTRAVENOUS | Status: AC
  Administered 2018-12-02: 12:00:00 2 g via INTRAVENOUS

## 2018-12-02 MED ORDER — DEXAMETHASONE SODIUM PHOSPHATE 10 MG/ML IJ SOLN
INTRAMUSCULAR | Status: AC
  Filled 2018-12-02: qty 1

## 2018-12-02 MED ORDER — SODIUM CHLORIDE 0.9 % IR SOLN
Status: DC | PRN
  Administered 2018-12-02: 12000 mL via INTRAVESICAL

## 2018-12-02 MED ORDER — PROPOFOL 10 MG/ML IV BOLUS
INTRAVENOUS | Status: DC | PRN
  Administered 2018-12-02: 140 mg via INTRAVENOUS

## 2018-12-02 MED ORDER — ACETAMINOPHEN 10 MG/ML IV SOLN: 1000.0000 mg | Freq: Once | INTRAVENOUS | Status: DC | PRN

## 2018-12-02 MED ORDER — SUGAMMADEX SODIUM 200 MG/2ML IV SOLN
INTRAVENOUS | Status: AC
  Filled 2018-12-02: qty 2

## 2018-12-02 MED ORDER — ONDANSETRON HCL 4 MG/2ML IJ SOLN
INTRAMUSCULAR | Status: DC | PRN
  Administered 2018-12-02: 4 mg via INTRAVENOUS

## 2018-12-02 MED ORDER — ACETAMINOPHEN 160 MG/5ML PO SOLN: 325.0000 mg | Freq: Once | ORAL | Status: DC

## 2018-12-02 MED ORDER — FENTANYL CITRATE (PF) 250 MCG/5ML IJ SOLN
INTRAMUSCULAR | Status: AC
  Filled 2018-12-02: qty 5

## 2018-12-02 MED ORDER — CEFAZOLIN SODIUM-DEXTROSE 2-4 GM/100ML-% IV SOLN
INTRAVENOUS | Status: AC
  Filled 2018-12-02: qty 100

## 2018-12-02 MED ORDER — STERILE WATER FOR IRRIGATION IR SOLN
Status: DC | PRN
  Administered 2018-12-02: 500 mL

## 2018-12-02 MED ORDER — PROPOFOL 10 MG/ML IV BOLUS
INTRAVENOUS | Status: AC
  Filled 2018-12-02: qty 20

## 2018-12-02 MED ORDER — DEXAMETHASONE SODIUM PHOSPHATE 10 MG/ML IJ SOLN
INTRAMUSCULAR | Status: DC | PRN
  Administered 2018-12-02: 10 mg via INTRAVENOUS

## 2018-12-02 MED ORDER — MIDAZOLAM HCL 5 MG/5ML IJ SOLN
INTRAMUSCULAR | Status: DC | PRN
  Administered 2018-12-02: 2 mg via INTRAVENOUS

## 2018-12-02 MED ORDER — LACTATED RINGERS IV SOLN
INTRAVENOUS | Status: DC
  Administered 2018-12-02 (×2): via INTRAVENOUS

## 2018-12-02 MED ORDER — ROCURONIUM BROMIDE 10 MG/ML (PF) SYRINGE
PREFILLED_SYRINGE | INTRAVENOUS | Status: DC | PRN
  Administered 2018-12-02: 40 mg via INTRAVENOUS

## 2018-12-02 MED ORDER — LIDOCAINE 2% (20 MG/ML) 5 ML SYRINGE
INTRAMUSCULAR | Status: DC | PRN
  Administered 2018-12-02: 40 mg via INTRAVENOUS

## 2018-12-02 MED ORDER — MEPERIDINE HCL 50 MG/ML IJ SOLN: 6.2500 mg | INTRAMUSCULAR | Status: DC | PRN

## 2018-12-02 MED ORDER — 0.9 % SODIUM CHLORIDE (POUR BTL) OPTIME
TOPICAL | Status: DC | PRN
  Administered 2018-12-02: 1000 mL

## 2018-12-02 MED ORDER — SUGAMMADEX SODIUM 200 MG/2ML IV SOLN
INTRAVENOUS | Status: DC | PRN
  Administered 2018-12-02: 200 mg via INTRAVENOUS

## 2018-12-02 MED ORDER — PROMETHAZINE HCL 25 MG/ML IJ SOLN: 6.2500 mg | INTRAMUSCULAR | Status: DC | PRN

## 2018-12-02 MED ORDER — LACTATED RINGERS IV SOLN: INTRAVENOUS | Status: DC

## 2018-12-02 SURGICAL SUPPLY — 19 items
BAG URINE DRAINAGE (UROLOGICAL SUPPLIES) ×2 IMPLANT
BAG URO CATCHER STRL LF (MISCELLANEOUS) ×3 IMPLANT
CATH FOLEY 3WAY 30CC 22FR (CATHETERS) ×2 IMPLANT
COVER WAND RF STERILE (DRAPES) IMPLANT
ELECT REM PT RETURN 15FT ADLT (MISCELLANEOUS) ×3 IMPLANT
EVACUATOR MICROVAS BLADDER (UROLOGICAL SUPPLIES) IMPLANT
GLOVE BIOGEL M 8.0 STRL (GLOVE) ×3 IMPLANT
GOWN STRL REUS W/TWL LRG LVL3 (GOWN DISPOSABLE) ×3 IMPLANT
GOWN STRL REUS W/TWL XL LVL3 (GOWN DISPOSABLE) ×3 IMPLANT
KIT TURNOVER KIT A (KITS) ×2 IMPLANT
LOOP CUT BIPOLAR 24F LRG (ELECTROSURGICAL) ×2 IMPLANT
MANIFOLD NEPTUNE II (INSTRUMENTS) ×3 IMPLANT
PACK CYSTO (CUSTOM PROCEDURE TRAY) ×3 IMPLANT
SET ASPIRATION TUBING (TUBING) IMPLANT
SYR 30ML LL (SYRINGE) ×2 IMPLANT
SYRINGE IRR TOOMEY STRL 70CC (SYRINGE) ×2 IMPLANT
TUBING CONNECTING 10 (TUBING) ×2 IMPLANT
TUBING CONNECTING 10' (TUBING) ×1
TUBING UROLOGY SET (TUBING) ×3 IMPLANT

## 2018-12-02 NOTE — Discharge Instructions (Signed)

## 2018-12-02 NOTE — Op Note (Signed)
.  Preoperative diagnosis: bladder tumor  Postoperative diagnosis: Same  Procedure: 1 cystoscopy 2. Transurethral resection of bladder tumor, large  Attending: Rosie Fate  Anesthesia: General  Estimated blood loss: Minimal  Drains: 22 French foley  Specimens: bladder tumor  Antibiotics: ancef  Findings: 6cm sessile lesion involving trigone and left lateral wall. Unable to identify ureteral orifices.  Indications: Patient is a 53 year old male with a history of bladder tumor and gross hematuria.  After discussing treatment options, they decided proceed with transurethral resection of a bladder tumor.  Procedure her in detail: The patient was brought to the operating room and a brief timeout was done to ensure correct patient, correct procedure, correct site.  General anesthesia was administered patient was placed in dorsal lithotomy position.  Their genitalia was then prepped and draped in usual sterile fashion.  A rigid 37 French cystoscope was passed in the urethra and the bladder.  Bladder was inspected and we noted a 6cm bladder tumor.  We were unable to identify the ureteral orifices. Using the bipolar resectoscope we removed the bladder tumor down to the base. Hemostasis was then obtained with electrocautery. We then removed the bladder tumor chips and sent them for pathology. We then re-inspected the bladder and found no residual bleeding.  the bladder was then drained, a 22 French foley was placed and this concluded the procedure which was well tolerated by patient.  Complications: None  Condition: Stable, extubated, transferred to PACU  Plan: Patient is to be discharged home and followup in 5 days for foley catheter removal and pathology discussion.

## 2018-12-02 NOTE — Anesthesia Preprocedure Evaluation (Addendum)
Anesthesia Evaluation  Patient identified by MRN, date of birth, ID band Patient awake    Reviewed: Allergy & Precautions, NPO status , Patient's Chart, lab work & pertinent test results  Airway Mallampati: II  TM Distance: >3 FB Neck ROM: Full    Dental  (+) Teeth Intact, Dental Advisory Given   Pulmonary neg pulmonary ROS,    breath sounds clear to auscultation       Cardiovascular negative cardio ROS   Rhythm:Regular Rate:Normal     Neuro/Psych negative neurological ROS  negative psych ROS   GI/Hepatic Neg liver ROS, GERD  ,  Endo/Other  negative endocrine ROS  Renal/GU negative Renal ROS     Musculoskeletal negative musculoskeletal ROS (+)   Abdominal Normal abdominal exam  (+)   Peds  Hematology negative hematology ROS (+)   Anesthesia Other Findings   Reproductive/Obstetrics                            Anesthesia Physical Anesthesia Plan  ASA: II  Anesthesia Plan: General   Post-op Pain Management:    Induction: Intravenous  PONV Risk Score and Plan: 3 and Ondansetron, Dexamethasone and Midazolam  Airway Management Planned: Oral ETT  Additional Equipment: None  Intra-op Plan:   Post-operative Plan: Extubation in OR  Informed Consent:   Plan Discussed with: CRNA  Anesthesia Plan Comments:         Anesthesia Quick Evaluation

## 2018-12-02 NOTE — H&P (Signed)
Urology Admission H&P  Chief Complaint: gross hematuria  History of Present Illness: Mr Darren Brady is a 53yo with a hx of gross hematuria who was found to have a locally invasive tumor involving his bladder, left ureter and prostate. He denies nay LUTS. No flank pain. No fevers/chills/sweats. Tumor was confirmed on office cystoscopy  Past Medical History:  Diagnosis Date  . Bladder tumor   . GERD (gastroesophageal reflux disease)    occasional after taking flomax  . Hypogonadism male   . Nocturia   . Seasonal allergies   . Wears glasses    Past Surgical History:  Procedure Laterality Date  . CARDIAC CATHETERIZATION  11-21-2002  dr w. Albertine Patricia  @[facility]    normal coronary arteries,  ef 60%  . NO PAST SURGERIES      Home Medications:  Current Facility-Administered Medications  Medication Dose Route Frequency Provider Last Rate Last Dose  . ceFAZolin (ANCEF) 2-4 GM/100ML-% IVPB           . ceFAZolin (ANCEF) IVPB 2g/100 mL premix  2 g Intravenous 30 min Pre-Op Charron Coultas, Candee Furbish, MD      . lactated ringers infusion   Intravenous Continuous Effie Berkshire, MD 50 mL/hr at 12/02/18 2355     Allergies: No Known Allergies  Family History  Problem Relation Age of Onset  . Hyperlipidemia Other   . Hypertension Other   . Kidney disease Other   . Thyroid disease Other   . Coronary artery disease Other   . Cancer Neg Hx    Social History:  reports that he has never smoked. His smokeless tobacco use includes snuff. He reports current alcohol use of about 7.0 standard drinks of alcohol per week. He reports that he does not use drugs.  Review of Systems  Genitourinary: Positive for hematuria.  All other systems reviewed and are negative.   Physical Exam:  Vital signs in last 24 hours: Temp:  [98.5 F (36.9 C)] 98.5 F (36.9 C) (04/13 0945) Pulse Rate:  [78] 78 (04/13 0945) Resp:  [15] 15 (04/13 0945) BP: (149)/(100) 149/100 (04/13 0945) SpO2:  [100 %] 100 % (04/13 0945) Physical Exam   Constitutional: He is oriented to person, place, and time. He appears well-developed and well-nourished.  HENT:  Head: Normocephalic and atraumatic.  Eyes: Pupils are equal, round, and reactive to light. EOM are normal.  Neck: Normal range of motion. No thyromegaly present.  Cardiovascular: Normal rate and regular rhythm.  Respiratory: Effort normal. No respiratory distress.  GI: Soft. He exhibits no distension.  Musculoskeletal: Normal range of motion.        General: No edema.  Neurological: He is alert and oriented to person, place, and time.  Skin: Skin is warm and dry.  Psychiatric: He has a normal mood and affect. His behavior is normal. Judgment and thought content normal.    Laboratory Data:  Results for orders placed or performed during the hospital encounter of 12/02/18 (from the past 24 hour(s))  CBC with Differential/Platelet     Status: None   Collection Time: 12/02/18  9:36 AM  Result Value Ref Range   WBC 7.3 4.0 - 10.5 K/uL   RBC 5.66 4.22 - 5.81 MIL/uL   Hemoglobin 15.8 13.0 - 17.0 g/dL   HCT 48.6 39.0 - 52.0 %   MCV 85.9 80.0 - 100.0 fL   MCH 27.9 26.0 - 34.0 pg   MCHC 32.5 30.0 - 36.0 g/dL   RDW 12.9 11.5 - 15.5 %  Platelets 253 150 - 400 K/uL   nRBC 0.0 0.0 - 0.2 %   Neutrophils Relative % 64 %   Neutro Abs 4.7 1.7 - 7.7 K/uL   Lymphocytes Relative 22 %   Lymphs Abs 1.6 0.7 - 4.0 K/uL   Monocytes Relative 9 %   Monocytes Absolute 0.6 0.1 - 1.0 K/uL   Eosinophils Relative 3 %   Eosinophils Absolute 0.2 0.0 - 0.5 K/uL   Basophils Relative 1 %   Basophils Absolute 0.1 0.0 - 0.1 K/uL   Immature Granulocytes 1 %   Abs Immature Granulocytes 0.04 0.00 - 0.07 K/uL  Basic metabolic panel     Status: Abnormal   Collection Time: 12/02/18  9:36 AM  Result Value Ref Range   Sodium 140 135 - 145 mmol/L   Potassium 4.1 3.5 - 5.1 mmol/L   Chloride 106 98 - 111 mmol/L   CO2 23 22 - 32 mmol/L   Glucose, Bld 105 (H) 70 - 99 mg/dL   BUN 20 6 - 20 mg/dL    Creatinine, Ser 1.51 (H) 0.61 - 1.24 mg/dL   Calcium 9.7 8.9 - 10.3 mg/dL   GFR calc non Af Amer 52 (L) >60 mL/min   GFR calc Af Amer 60 (L) >60 mL/min   Anion gap 11 5 - 15   No results found for this or any previous visit (from the past 240 hour(s)). Creatinine: Recent Labs    12/02/18 0936  CREATININE 1.51*   Baseline Creatinine: 1.5  Impression/Assessment:  53yo with bladder tumor  Plan:  The risks/benefits/alterantives to TURBT was explained to the patient and he understands and wishes to proceed with surgery  Nicolette Bang 12/02/2018, 11:07 AM

## 2018-12-02 NOTE — Anesthesia Postprocedure Evaluation (Addendum)
Anesthesia Post Note  Patient: Darren Brady.  Procedure(s) Performed: TRANSURETHRAL RESECTION OF BLADDER TUMOR (TURBT) (N/A )     Patient location during evaluation: PACU Anesthesia Type: General Level of consciousness: awake and alert Pain management: pain level controlled Vital Signs Assessment: post-procedure vital signs reviewed and stable Respiratory status: spontaneous breathing, nonlabored ventilation, respiratory function stable and patient connected to nasal cannula oxygen Cardiovascular status: blood pressure returned to baseline and stable Postop Assessment: no apparent nausea or vomiting Anesthetic complications: yes Comments: Laryngospasm requiring application of continued positive pressure until resolution. No oxygenation issues in the PACU.      Last Vitals:  Vitals:   12/02/18 1345 12/02/18 1415  BP: 138/87 132/81  Pulse: 79 80  Resp: 19 16  Temp: 36.7 C 36.6 C  SpO2: 97% 98%    Last Pain:  Vitals:   12/02/18 1415  TempSrc:   PainSc: [name]

## 2018-12-02 NOTE — Transfer of Care (Signed)
Immediate Anesthesia Transfer of Care Note  Patient: Darren Brady.  Procedure(s) Performed: TRANSURETHRAL RESECTION OF BLADDER TUMOR (TURBT) (N/A )  Patient Location: PACU  Anesthesia Type:General  Level of Consciousness: sedated  Airway & Oxygen Therapy: Patient Spontanous Breathing and Patient connected to face mask oxygen  Post-op Assessment: Report given to RN and Post -op Vital signs reviewed and stable  Post vital signs: Reviewed and stable  Last Vitals:  Vitals Value Taken Time  BP 150/106 12/02/2018 12:42 PM  Temp    Pulse 89 12/02/2018 12:48 PM  Resp 19 12/02/2018 12:43 PM  SpO2 98 % 12/02/2018 12:48 PM  Vitals shown include unvalidated device data.  Last Pain:  Vitals:   12/02/18 0945  TempSrc: Oral         Complications: No apparent anesthesia complications

## 2018-12-02 NOTE — Anesthesia Procedure Notes (Signed)
Procedure Name: Intubation Date/Time: 12/02/2018 11:37 AM Performed by: Talbot Grumbling, CRNA Pre-anesthesia Checklist: Emergency Drugs available, Patient being monitored, Suction available and Patient identified Patient Re-evaluated:Patient Re-evaluated prior to induction Oxygen Delivery Method: Circle system utilized Preoxygenation: Pre-oxygenation with 100% oxygen Induction Type: IV induction Laryngoscope Size: Mac and 3 Grade View: Grade II Tube type: Oral Tube size: 7.5 mm Airway Equipment and Method: Stylet Placement Confirmation: ETT inserted through vocal cords under direct vision,  positive ETCO2 and breath sounds checked- equal and bilateral Secured at: 23 cm Tube secured with: Tape Dental Injury: Teeth and Oropharynx as per pre-operative assessment

## 2018-12-03 ENCOUNTER — Encounter (HOSPITAL_COMMUNITY): Payer: Self-pay | Admitting: Urology

## 2018-12-09 ENCOUNTER — Other Ambulatory Visit: Payer: Self-pay | Admitting: Urology

## 2018-12-12 ENCOUNTER — Telehealth: Payer: Self-pay | Admitting: Oncology

## 2018-12-12 NOTE — Telephone Encounter (Signed)
A new patient appt has been scheduled for [name] to see Dr. Alen Blew on 4/24 at 2pm. Pt aware to arrive 30 minutes early.

## 2018-12-13 ENCOUNTER — Inpatient Hospital Stay: Payer: BLUE CROSS/BLUE SHIELD | Attending: Oncology | Admitting: Oncology

## 2018-12-13 ENCOUNTER — Other Ambulatory Visit: Payer: Self-pay

## 2018-12-13 VITALS — BP 135/79 | HR 77 | Temp 97.9°F | Resp 17 | Ht 67.0 in | Wt 188.0 lb

## 2018-12-13 DIAGNOSIS — C679 Malignant neoplasm of bladder, unspecified: Secondary | ICD-10-CM | POA: Diagnosis not present

## 2018-12-13 DIAGNOSIS — N1330 Unspecified hydronephrosis: Secondary | ICD-10-CM | POA: Insufficient documentation

## 2018-12-13 NOTE — Progress Notes (Signed)
Reason for the request:   Bladder cancer  HPI: I was asked by Dr. Tresa Moore to evaluate Mr. Goodson for diagnosis of bladder cancer.  He is a 53 year old man currently of in [place] where he lived majority of his life.  He is a rather healthy gentleman started developing symptoms of hematuria in the last few months.  He was evaluated by his primary care provider and subsequently referred to Dr. Alyson Ingles.  He underwent an evaluation with a CT scan of the abdomen with and without contrast on November 22, 2018.  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  Left-sided hydronephrosis was noted with borderline external iliac adenopathy noted.  Based on these findings, he underwent TURBT on April 13 of 2020 which showed a 6 cm sessile lesion involving the trigone of the right left lateral bladder wall.  The final pathology showed infiltrating high-grade urothelial carcinoma with squamous differentiation invading into the muscularis propria.  He was evaluated by Dr. Tresa Moore who recommended proceeding with radical cystectomy with consideration for chemotherapy in the adjuvant setting.  Clinically, he reports feeling well in the last 48 hours.  He is no longer reporting any hematuria or dysuria.  He is not reporting any abdominal pain or discomfort.  His appetite has improved and his performance status remains excellent.  He does not report any headaches, blurry vision, syncope or seizures. Does not report any fevers, chills or sweats.  Does not report any cough, wheezing or hemoptysis.  Does not report any chest pain, palpitation, orthopnea or leg edema.  Does not report any nausea, vomiting or abdominal pain.  Does not report any constipation or diarrhea.  Does not report any skeletal complaints.    Does not report frequency, urgency or hematuria.  Does not report any skin rashes or lesions.  Does not report any heat or cold intolerance.  Does not report any lymphadenopathy or petechiae.  Does not report any anxiety or depression.  Remaining review of systems is negative.    Past Medical History:  Diagnosis Date  . Bladder tumor   . GERD (gastroesophageal reflux disease)    occasional after taking flomax  . Hypogonadism male   . Nocturia   . Seasonal allergies   . Wears glasses   :  Past Surgical History:  Procedure Laterality Date  . CARDIAC CATHETERIZATION  11-21-2002  dr w. Albertine Patricia  @[facility]    normal coronary arteries,  ef 60%  . NO PAST SURGERIES    . TRANSURETHRAL RESECTION OF BLADDER TUMOR N/A 12/02/2018   Procedure: TRANSURETHRAL RESECTION OF BLADDER TUMOR (TURBT);  Surgeon: Cleon Gustin, MD;  Location: [facility];  Service: Urology;  Laterality: N/A;  30 MINS  :   Current Outpatient Medications:  .  Ibuprofen-diphenhydrAMINE Cit (ADVIL PM) 200-38 MG TABS, Take 1 tablet by mouth at bedtime as needed (pain/sleep.)., Disp: , Rfl:  .  oxyCODONE-acetaminophen (PERCOCET) 5-325 MG tablet, Take 1 tablet by mouth every 4 (four) hours as needed for moderate pain or severe pain., Disp: 30 tablet, Rfl: 0 .  tamsulosin (FLOMAX) 0.4 MG CAPS capsule, Take 0.4 mg by mouth at bedtime., Disp: , Rfl:  .  Testosterone 20.25 MG/ACT (1.62%) GEL, Apply 1 Pump topically daily. Apply 1 pump to shoulder areas, Disp: , Rfl: :  No Known Allergies:  Family History  Problem Relation Age of Onset  . Hyperlipidemia Other   .  Hypertension Other   . Kidney disease Other   . Thyroid disease Other   . Coronary artery disease Other   . Cancer Neg Hx   :  Social History   Socioeconomic History  . Marital status: Married    Spouse name: Not on file  . Number of children: Not on file  . Years of education: Not on file  . Highest education level: Not on file  Occupational History  . Occupation: Health and safety inspector: [employer]  Social Needs  . Financial resource strain: Not on  file  . Food insecurity:    Worry: Not on file    Inability: Not on file  . Transportation needs:    Medical: Not on file    Non-medical: Not on file  Tobacco Use  . Smoking status: Never Smoker  . Smokeless tobacco: Current User    Types: Snuff  . Tobacco comment: per pt dip tobacco since teen   Substance and Sexual Activity  . Alcohol use: Yes    Alcohol/week: 7.0 standard drinks    Types: 7 Cans of beer per week    Comment: one beer daily  . Drug use: Never  . Sexual activity: Yes  Lifestyle  . Physical activity:    Days per week: Not on file    Minutes per session: Not on file  . Stress: Not on file  Relationships  . Social connections:    Talks on phone: Not on file    Gets together: Not on file    Attends religious service: Not on file    Active member of club or organization: Not on file    Attends meetings of clubs or organizations: Not on file    Relationship status: Not on file  . Intimate partner violence:    Fear of current or ex partner: Not on file    Emotionally abused: Not on file    Physically abused: Not on file    Forced sexual activity: Not on file  Other Topics Concern  . Not on file  Social History Narrative   Regular Exercise -  YES        :  Pertinent items are noted in HPI.  Exam: Blood pressure 135/79, pulse 77, temperature 97.9 F (36.6 C), temperature source Oral, resp. rate 17, height 5\' 7"  (1.702 m), weight 188 lb (85.3 kg), SpO2 99 %.   ECOG 0 General appearance: alert and cooperative appeared without distress. Head: atraumatic without any abnormalities. Eyes: conjunctivae/corneas clear. PERRL.  Sclera anicteric. Throat: lips, mucosa, and tongue normal; without oral thrush or ulcers. Resp: clear to auscultation bilaterally without rhonchi, wheezes or dullness to percussion. Cardio: regular rate and rhythm, S1, S2 normal, no murmur, click, rub or gallop GI: soft, non-tender; bowel sounds normal; no masses,  no organomegaly Skin:  Skin color, texture, turgor normal. No rashes or lesions Lymph nodes: Cervical, supraclavicular, and axillary nodes normal. Neurologic: Grossly normal without any motor, sensory or deep tendon reflexes. Musculoskeletal: No joint deformity or effusion.  CBC    Component Value Date/Time   WBC 7.3 12/02/2018 0936   RBC 5.66 12/02/2018 0936   HGB 15.8 12/02/2018 0936   HCT 48.6 12/02/2018 0936   PLT 253 12/02/2018 0936   MCV 85.9 12/02/2018 0936   MCH 27.9 12/02/2018 0936   MCHC 32.5 12/02/2018 0936   RDW 12.9 12/02/2018 0936   LYMPHSABS 1.6 12/02/2018 0936   MONOABS 0.6 12/02/2018 0936   EOSABS 0.2 12/02/2018 0936  BASOSABS 0.1 12/02/2018 0936     Chemistry      Component Value Date/Time   NA 140 12/02/2018 0936   K 4.1 12/02/2018 0936   CL 106 12/02/2018 0936   CO2 23 12/02/2018 0936   BUN 20 12/02/2018 0936   CREATININE 1.51 (H) 12/02/2018 0936      Component Value Date/Time   CALCIUM 9.7 12/02/2018 0936   ALKPHOS 65 01/24/2015 1203   AST 26 01/24/2015 1203   ALT 36 01/24/2015 1203   BILITOT 0.9 01/24/2015 1203       Assessment and Plan:   53 year old man with the following:  1.  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  He presented with hematuria and potentially locally advanced tumor with disease into the prostate and causing hydronephrosis.  The natural course of this disease was discussed today with the patient and treatment options were discussed.  Primary surgical therapy remains the only curative option for him with the role of systemic chemotherapy that is very essential.  I do agree with him proceeding with primary surgery upfront and using chemotherapy adjuvantly.  His disease is quite symptomatic and causing hydronephrosis and worsening kidney function.  He also has 90% squamous differentiation which could render this disease chemo resistant and delaying surgery in this  particular population can be detrimental to the outcome.  I recommended proceeding with surgery upfront as scheduled and the role of adjuvant chemotherapy will be assessed postoperatively.  I discussed this option with him today as well.  The rationale for using chemotherapy was reviewed.  Complication associated with chemotherapy was also discussed today.  This will include nausea, vomiting, myelosuppression, neutropenia and potentially neutropenic sepsis.  The exact schedule and details of that will be deferred till his after his surgery.  Staging work-up including imaging studies of the chest will be required prior to proceeding with any chemotherapy.  All his questions were answered to his satisfaction.  2.  Follow-up: Follow-up will be scheduled upon completing and recovering from his surgery.  Tentatively will be mid-to-late July.  60  minutes was spent with the patient face-to-face today.  More than 50% of time was spent on reviewing imaging studies, laboratory data and discussing differential diagnosis, natural course of this disease and treatment options.     Thank you for the referral.  A copy of this consult has been forwarded to the requesting physician.

## 2018-12-16 ENCOUNTER — Telehealth: Payer: Self-pay | Admitting: Oncology

## 2018-12-16 NOTE — Telephone Encounter (Signed)
Scheduled appt per 4/24 sch message. °

## 2018-12-20 HISTORY — PX: ROBOT ASSISTED LAPAROSCOPIC COMPLETE CYSTECT ILEAL CONDUIT: SHX5139

## 2018-12-26 ENCOUNTER — Other Ambulatory Visit: Payer: Self-pay | Admitting: Urology

## 2018-12-31 ENCOUNTER — Other Ambulatory Visit (HOSPITAL_COMMUNITY): Payer: BLUE CROSS/BLUE SHIELD

## 2018-12-31 ENCOUNTER — Inpatient Hospital Stay (HOSPITAL_COMMUNITY): Admission: RE | Admit: 2018-12-31 | Payer: BLUE CROSS/BLUE SHIELD | Source: Home / Self Care | Admitting: Urology

## 2018-12-31 ENCOUNTER — Inpatient Hospital Stay: Admission: RE | Admit: 2018-12-31 | Payer: BLUE CROSS/BLUE SHIELD | Source: Ambulatory Visit | Admitting: Urology

## 2018-12-31 ENCOUNTER — Inpatient Hospital Stay (HOSPITAL_COMMUNITY)
Admission: RE | Admit: 2018-12-31 | Discharge: 2019-01-06 | DRG: 654 | Disposition: A | Discharge status: Home Health | Payer: BLUE CROSS/BLUE SHIELD | Attending: Urology | Admitting: Urology

## 2018-12-31 ENCOUNTER — Other Ambulatory Visit: Payer: Self-pay

## 2018-12-31 ENCOUNTER — Encounter (HOSPITAL_COMMUNITY): Payer: Self-pay

## 2018-12-31 DIAGNOSIS — Z79899 Other long term (current) drug therapy: Secondary | ICD-10-CM

## 2018-12-31 DIAGNOSIS — C61 Malignant neoplasm of prostate: Secondary | ICD-10-CM | POA: Diagnosis present

## 2018-12-31 DIAGNOSIS — K9189 Other postprocedural complications and disorders of digestive system: Secondary | ICD-10-CM | POA: Diagnosis present

## 2018-12-31 DIAGNOSIS — K567 Ileus, unspecified: Secondary | ICD-10-CM | POA: Diagnosis present

## 2018-12-31 DIAGNOSIS — R351 Nocturia: Secondary | ICD-10-CM | POA: Diagnosis present

## 2018-12-31 DIAGNOSIS — K660 Peritoneal adhesions (postprocedural) (postinfection): Secondary | ICD-10-CM | POA: Diagnosis present

## 2018-12-31 DIAGNOSIS — K219 Gastro-esophageal reflux disease without esophagitis: Secondary | ICD-10-CM | POA: Diagnosis present

## 2018-12-31 DIAGNOSIS — C679 Malignant neoplasm of bladder, unspecified: Principal | ICD-10-CM | POA: Diagnosis present

## 2018-12-31 DIAGNOSIS — Z720 Tobacco use: Secondary | ICD-10-CM

## 2018-12-31 DIAGNOSIS — Z8249 Family history of ischemic heart disease and other diseases of the circulatory system: Secondary | ICD-10-CM

## 2018-12-31 DIAGNOSIS — D6859 Other primary thrombophilia: Secondary | ICD-10-CM | POA: Diagnosis present

## 2018-12-31 DIAGNOSIS — R590 Localized enlarged lymph nodes: Secondary | ICD-10-CM | POA: Diagnosis present

## 2018-12-31 DIAGNOSIS — Z1159 Encounter for screening for other viral diseases: Secondary | ICD-10-CM

## 2018-12-31 DIAGNOSIS — N135 Crossing vessel and stricture of ureter without hydronephrosis: Secondary | ICD-10-CM | POA: Diagnosis present

## 2018-12-31 DIAGNOSIS — N261 Atrophy of kidney (terminal): Secondary | ICD-10-CM | POA: Diagnosis present

## 2018-12-31 DIAGNOSIS — Z8349 Family history of other endocrine, nutritional and metabolic diseases: Secondary | ICD-10-CM

## 2018-12-31 DIAGNOSIS — Z841 Family history of disorders of kidney and ureter: Secondary | ICD-10-CM

## 2018-12-31 DIAGNOSIS — E291 Testicular hypofunction: Secondary | ICD-10-CM | POA: Diagnosis present

## 2018-12-31 LAB — CBC
HCT: 41.6 % (ref 39.0–52.0)
Hemoglobin: 13.3 g/dL (ref 13.0–17.0)
MCH: 27.3 pg (ref 26.0–34.0)
MCHC: 32 g/dL (ref 30.0–36.0)
MCV: 85.4 fL (ref 80.0–100.0)
Platelets: 277 10*3/uL (ref 150–400)
RBC: 4.87 MIL/uL (ref 4.22–5.81)
RDW: 12.7 % (ref 11.5–15.5)
WBC: 6.8 10*3/uL (ref 4.0–10.5)
nRBC: 0 % (ref 0.0–0.2)

## 2018-12-31 LAB — SURGICAL PCR SCREEN
MRSA, PCR: NEGATIVE
Staphylococcus aureus: POSITIVE — AB

## 2018-12-31 LAB — COMPREHENSIVE METABOLIC PANEL
ALT: 49 U/L — ABNORMAL HIGH (ref 0–44)
AST: 28 U/L (ref 15–41)
Albumin: 4 g/dL (ref 3.5–5.0)
Alkaline Phosphatase: 109 U/L (ref 38–126)
Anion gap: 6 (ref 5–15)
BUN: 24 mg/dL — ABNORMAL HIGH (ref 6–20)
CO2: 24 mmol/L (ref 22–32)
Calcium: 9.2 mg/dL (ref 8.9–10.3)
Chloride: 108 mmol/L (ref 98–111)
Creatinine, Ser: 1.48 mg/dL — ABNORMAL HIGH (ref 0.61–1.24)
GFR calc Af Amer: >60 mL/min (ref >60)
GFR calc non Af Amer: 53 mL/min — ABNORMAL LOW (ref >60)
Glucose, Bld: 99 mg/dL (ref 70–99)
Potassium: 4.2 mmol/L (ref 3.5–5.1)
Sodium: 138 mmol/L (ref 135–145)
Total Bilirubin: 0.7 mg/dL (ref 0.3–1.2)
Total Protein: 7.1 g/dL (ref 6.5–8.1)

## 2018-12-31 LAB — PREPARE RBC (CROSSMATCH)

## 2018-12-31 LAB — ABO/RH: ABO/RH(D): A POS

## 2018-12-31 LAB — SARS CORONAVIRUS 2 BY RT PCR (HOSPITAL ORDER, PERFORMED IN ~~LOC~~ HOSPITAL LAB): SARS Coronavirus 2: NEGATIVE

## 2018-12-31 MED ORDER — NEOMYCIN SULFATE 500 MG PO TABS
1000.0000 mg | ORAL_TABLET | Freq: Three times a day (TID) | ORAL | Status: AC
  Administered 2018-12-31 (×2): 1000 mg via ORAL
  Filled 2018-12-31 (×2): qty 2

## 2018-12-31 MED ORDER — PIPERACILLIN-TAZOBACTAM 3.375 G IVPB 30 MIN
3.3750 g | INTRAVENOUS | Status: AC
  Administered 2019-01-01: 08:00:00 3.375 g via INTRAVENOUS
  Filled 2018-12-31 (×2): qty 50

## 2018-12-31 MED ORDER — MUPIROCIN 2 % EX OINT: 1.0000 "application " | TOPICAL_OINTMENT | Freq: Two times a day (BID) | CUTANEOUS | Status: DC

## 2018-12-31 MED ORDER — PIPERACILLIN-TAZOBACTAM 3.375 G IVPB 30 MIN
3.3750 g | INTRAVENOUS | Status: DC
  Filled 2018-12-31: qty 50

## 2018-12-31 MED ORDER — METRONIDAZOLE 500 MG PO TABS
500.0000 mg | ORAL_TABLET | Freq: Three times a day (TID) | ORAL | Status: AC
  Administered 2018-12-31 (×2): 500 mg via ORAL
  Filled 2018-12-31 (×2): qty 1

## 2018-12-31 MED ORDER — ALVIMOPAN 12 MG PO CAPS
12.0000 mg | ORAL_CAPSULE | ORAL | Status: AC
  Administered 2019-01-01: 12 mg via ORAL
  Filled 2018-12-31: qty 1

## 2018-12-31 MED ORDER — MUPIROCIN 2 % EX OINT
1.0000 "application " | TOPICAL_OINTMENT | Freq: Two times a day (BID) | CUTANEOUS | Status: AC
  Administered 2018-12-31 – 2019-01-05 (×9): 1 via NASAL
  Filled 2018-12-31 (×3): qty 22

## 2018-12-31 MED ORDER — PEG 3350-KCL-NA BICARB-NACL 420 G PO SOLR
4000.0000 mL | Freq: Once | ORAL | Status: AC
  Administered 2018-12-31: 4000 mL via ORAL

## 2018-12-31 MED ORDER — CHLORHEXIDINE GLUCONATE CLOTH 2 % EX PADS
6.0000 | MEDICATED_PAD | Freq: Every day | CUTANEOUS | Status: AC
  Administered 2019-01-01 – 2019-01-04 (×4): 6 via TOPICAL

## 2018-12-31 NOTE — Consult Note (Signed)
[facility] Nurse ostomy consult note  [place] Nurse requested for preoperative stoma site marking by Dr. Clint Lipps  Discussed surgical procedure and stoma creation with patient.  Explained role of the [facility] nurse team.  Answered patient questions.   Examined patient lying, sitting, and standing in order to place the marking in the patient's visual field, away from any creases or abdominal contour issues and within the rectus muscle.  Attempted to mark below the patient's belt line, but he wears his waistband rather low on the abdomen.  Marked for ileal conduit in the RUQ  7cm to the right of the umbilicus and  3.5 cm above the umbilicus.  Patient's abdomen cleansed with CHG wipes at site markings, allowed to air dry prior to marking. Covered mark with thin film transparent dressing to preserve mark until date of surgery (tomorrow, 01/01/2019).   [facility] Nurse team will follow up with patient after surgery for continue ostomy care and teaching.   Maudie Flakes, MSN, RN, [place], Arther Abbott  Pager# [phone] [place] MSN, [place], [place], [place]

## 2018-12-31 NOTE — Progress Notes (Signed)
CRITICAL VALUE ALERT  Critical Value:  Staphylococcus aureus (+)  Date & Time Notied:  12/31/18 2230  Orders Received/Actions taken: Standing orders initiated

## 2018-12-31 NOTE — Anesthesia Preprocedure Evaluation (Addendum)
Anesthesia Evaluation  Patient identified by MRN, date of birth, ID band Patient awake    Reviewed: Allergy & Precautions, NPO status , Patient's Chart, lab work & pertinent test results  History of Anesthesia Complications Negative for: history of anesthetic complications  Airway Mallampati: II  TM Distance: >3 FB Neck ROM: Full    Dental  (+) Dental Advisory Given, Missing,    Pulmonary neg pulmonary ROS,    Pulmonary exam normal breath sounds clear to auscultation       Cardiovascular negative cardio ROS Normal cardiovascular exam Rhythm:Regular Rate:Normal     Neuro/Psych negative neurological ROS     GI/Hepatic Neg liver ROS, GERD  Controlled,  Endo/Other  negative endocrine ROS  Renal/GU negative Renal ROS   Bladder ca    Musculoskeletal negative musculoskeletal ROS (+)   Abdominal   Peds  Hematology negative hematology ROS (+)   Anesthesia Other Findings Day of surgery medications reviewed with the patient.  Reproductive/Obstetrics                            Anesthesia Physical Anesthesia Plan  ASA: II  Anesthesia Plan: General   Post-op Pain Management:    Induction: Intravenous  PONV Risk Score and Plan: 3 and Treatment may vary due to age or medical condition, Ondansetron, Dexamethasone and Midazolam  Airway Management Planned: Oral ETT  Additional Equipment:   Intra-op Plan:   Post-operative Plan: Extubation in OR  Informed Consent: I have reviewed the patients History and Physical, chart, labs and discussed the procedure including the risks, benefits and alternatives for the proposed anesthesia with the patient or authorized representative who has indicated his/her understanding and acceptance.     Dental advisory given  Plan Discussed with: CRNA  Anesthesia Plan Comments:        Anesthesia Quick Evaluation

## 2018-12-31 NOTE — H&P (Signed)
Darren Brady Darren Brady. is an 53 y.o. male.    Chief Complaint: Pre-Op Cystoprostatectomy  HPI:   1 - Stage 4 Bladder Cancer - large left trigone high grade urothelial cancer with 90% Sq differentiation with prostate / SV invasion and left renal obstruction by CT and TURBT 11/2018 on eval gross hematuria. No overtly metastatic disease, but some borderline left ext. iliac adnopathy.   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 Still some enhancing parencheyma but relative function clearly diminished. Cr 11/2018 1.5.   PMH unremarkable. NO non-GU surgeries. NO CV disease / blood thinners. 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   Today " Darren Brady" is seen as pre-op admission prior to cystoprostatectomy tomorrow.    Past Medical History:  Diagnosis Date  . Bladder tumor   . GERD (gastroesophageal reflux disease)    occasional after taking flomax  . Hypogonadism male   . Nocturia   . Seasonal allergies   . Wears glasses     Past Surgical History:  Procedure Laterality Date  . CARDIAC CATHETERIZATION  11-21-2002  dr w. Albertine Patricia  @[facility]    normal coronary arteries,  ef 60%  . NO PAST SURGERIES    . TRANSURETHRAL RESECTION OF BLADDER TUMOR N/A 12/02/2018   Procedure: TRANSURETHRAL RESECTION OF BLADDER TUMOR (TURBT);  Surgeon: Cleon Gustin, MD;  Location: [facility];  Service: Urology;  Laterality: N/A;  30 MINS    Family History  Problem Relation Age of Onset  . Hyperlipidemia Other   . Hypertension Other   . Kidney disease Other   . Thyroid disease Other   . Coronary artery disease Other   . Cancer Neg Hx    Social History:  reports that he has never smoked. His smokeless tobacco use includes snuff. He reports current alcohol use of about 7.0 standard drinks of alcohol per week. He reports that he does not use drugs.  Allergies: No Known  Allergies  Medications Prior to Admission  Medication Sig Dispense Refill  . tamsulosin (FLOMAX) 0.4 MG CAPS capsule Take 0.4 mg by mouth at bedtime.    . Testosterone 20.25 MG/ACT (1.62%) GEL Apply 1 Pump topically daily. Apply 1 pump to shoulder areas    . oxyCODONE-acetaminophen (PERCOCET) 5-325 MG tablet Take 1 tablet by mouth every 4 (four) hours as needed for moderate pain or severe pain. (Patient not taking: Reported on 12/26/2018) 30 tablet 0    No results found for this or any previous visit (from the past 48 hour(s)). No results found.  Review of Systems  Constitutional: Negative.   HENT: Negative.   Eyes: Negative.   Respiratory: Negative.   Cardiovascular: Negative.   Gastrointestinal: Negative.   Genitourinary: Negative.   Musculoskeletal: Negative.   Skin: Negative.   Neurological: Negative.   Endo/Heme/Allergies: Negative.   Psychiatric/Behavioral: Negative.     Blood pressure (!) 133/95, pulse 73, temperature 98.3 F (36.8 C), temperature source Oral, height 5\' 7"  (1.702 m), weight 83.5 kg, SpO2 100 %. Physical Exam  Constitutional: He appears well-developed.  HENT:  Head: Normocephalic.  Eyes: Pupils are equal, round, and reactive to light.  Neck: Normal range of motion.  Cardiovascular: Normal rate.  Respiratory: Effort normal.  GI: Soft.  Genitourinary:    Penis normal.   Musculoskeletal: Normal range of motion.  Neurological: He is alert.  Skin: Skin is warm.  Psychiatric: He has a normal mood and affect.     Assessment/Plan  Bowel prep, stomal marking, MIVF, Clears, NPO p MN, Consent in preparation for surgery tomorrow. Risks, benefits, alternatives, expected peri-op course discussed previously and reiterated today.   Alexis Frock, MD 12/31/2018, 11:30 AM

## 2019-01-01 ENCOUNTER — Encounter (HOSPITAL_COMMUNITY): Payer: Self-pay | Admitting: Certified Registered Nurse Anesthetist

## 2019-01-01 ENCOUNTER — Observation Stay (HOSPITAL_COMMUNITY): Payer: BLUE CROSS/BLUE SHIELD | Admitting: Anesthesiology

## 2019-01-01 ENCOUNTER — Encounter (HOSPITAL_COMMUNITY)
Admission: RE | Disposition: A | Discharge status: Home Health | Payer: Self-pay | Source: Home / Self Care | Attending: Urology

## 2019-01-01 DIAGNOSIS — K9189 Other postprocedural complications and disorders of digestive system: Secondary | ICD-10-CM | POA: Diagnosis present

## 2019-01-01 DIAGNOSIS — Z79899 Other long term (current) drug therapy: Secondary | ICD-10-CM | POA: Diagnosis not present

## 2019-01-01 DIAGNOSIS — C61 Malignant neoplasm of prostate: Secondary | ICD-10-CM | POA: Diagnosis present

## 2019-01-01 DIAGNOSIS — Z8249 Family history of ischemic heart disease and other diseases of the circulatory system: Secondary | ICD-10-CM | POA: Diagnosis not present

## 2019-01-01 DIAGNOSIS — R590 Localized enlarged lymph nodes: Secondary | ICD-10-CM | POA: Diagnosis present

## 2019-01-01 DIAGNOSIS — Z1159 Encounter for screening for other viral diseases: Secondary | ICD-10-CM | POA: Diagnosis not present

## 2019-01-01 DIAGNOSIS — E291 Testicular hypofunction: Secondary | ICD-10-CM | POA: Diagnosis present

## 2019-01-01 DIAGNOSIS — K660 Peritoneal adhesions (postprocedural) (postinfection): Secondary | ICD-10-CM | POA: Diagnosis present

## 2019-01-01 DIAGNOSIS — K219 Gastro-esophageal reflux disease without esophagitis: Secondary | ICD-10-CM | POA: Diagnosis present

## 2019-01-01 DIAGNOSIS — Z8349 Family history of other endocrine, nutritional and metabolic diseases: Secondary | ICD-10-CM | POA: Diagnosis not present

## 2019-01-01 DIAGNOSIS — N135 Crossing vessel and stricture of ureter without hydronephrosis: Secondary | ICD-10-CM | POA: Diagnosis present

## 2019-01-01 DIAGNOSIS — Z720 Tobacco use: Secondary | ICD-10-CM | POA: Diagnosis not present

## 2019-01-01 DIAGNOSIS — Z841 Family history of disorders of kidney and ureter: Secondary | ICD-10-CM | POA: Diagnosis not present

## 2019-01-01 DIAGNOSIS — R351 Nocturia: Secondary | ICD-10-CM | POA: Diagnosis present

## 2019-01-01 DIAGNOSIS — C679 Malignant neoplasm of bladder, unspecified: Secondary | ICD-10-CM | POA: Diagnosis present

## 2019-01-01 DIAGNOSIS — D6859 Other primary thrombophilia: Secondary | ICD-10-CM | POA: Diagnosis present

## 2019-01-01 DIAGNOSIS — N261 Atrophy of kidney (terminal): Secondary | ICD-10-CM | POA: Diagnosis present

## 2019-01-01 DIAGNOSIS — K567 Ileus, unspecified: Secondary | ICD-10-CM | POA: Diagnosis present

## 2019-01-01 HISTORY — PX: CYSTOSCOPY WITH INJECTION: SHX1424

## 2019-01-01 LAB — HEMOGLOBIN AND HEMATOCRIT, BLOOD
HCT: 41.7 % (ref 39.0–52.0)
Hemoglobin: 13.2 g/dL (ref 13.0–17.0)

## 2019-01-01 SURGERY — ROBOT ASSISTED LAPAROSCOPIC RADICAL CYSTOPROSTATECTOMY BILATERAL PELVIC LYMPHADENECTOMY,ORTHOTOPIC NEOBLADDER
Anesthesia: General

## 2019-01-01 MED ORDER — SODIUM CHLORIDE 0.9 % IR SOLN
Status: DC | PRN
  Administered 2019-01-01: 1000 mL via INTRAVESICAL

## 2019-01-01 MED ORDER — DIPHENHYDRAMINE HCL 50 MG/ML IJ SOLN: 12.5000 mg | Freq: Four times a day (QID) | INTRAMUSCULAR | Status: DC | PRN

## 2019-01-01 MED ORDER — HYDROMORPHONE HCL 1 MG/ML IJ SOLN
0.5000 mg | INTRAMUSCULAR | Status: DC | PRN
  Administered 2019-01-01 – 2019-01-03 (×13): 1 mg via INTRAVENOUS
  Administered 2019-01-03: 0.5 mg via INTRAVENOUS
  Filled 2019-01-01 (×14): qty 1

## 2019-01-01 MED ORDER — ONDANSETRON HCL 4 MG/2ML IJ SOLN
INTRAMUSCULAR | Status: DC | PRN
  Administered 2019-01-01: 4 mg via INTRAVENOUS

## 2019-01-01 MED ORDER — HYDROMORPHONE HCL 1 MG/ML IJ SOLN
0.2500 mg | INTRAMUSCULAR | Status: DC | PRN
  Administered 2019-01-01 (×3): 0.5 mg via INTRAVENOUS

## 2019-01-01 MED ORDER — FENTANYL CITRATE (PF) 250 MCG/5ML IJ SOLN
INTRAMUSCULAR | Status: DC | PRN
  Administered 2019-01-01: 150 ug via INTRAVENOUS
  Administered 2019-01-01 (×2): 50 ug via INTRAVENOUS
  Administered 2019-01-01: 100 ug via INTRAVENOUS
  Administered 2019-01-01 (×2): 50 ug via INTRAVENOUS

## 2019-01-01 MED ORDER — OXYCODONE HCL 5 MG PO TABS: 5.0000 mg | ORAL_TABLET | Freq: Once | ORAL | Status: DC | PRN

## 2019-01-01 MED ORDER — MIDAZOLAM HCL 5 MG/5ML IJ SOLN
INTRAMUSCULAR | Status: DC | PRN
  Administered 2019-01-01: 2 mg via INTRAVENOUS

## 2019-01-01 MED ORDER — ONDANSETRON HCL 4 MG/2ML IJ SOLN: 4.0000 mg | INTRAMUSCULAR | Status: DC | PRN

## 2019-01-01 MED ORDER — ACETAMINOPHEN 10 MG/ML IV SOLN
1000.0000 mg | Freq: Four times a day (QID) | INTRAVENOUS | Status: AC
  Administered 2019-01-01 – 2019-01-02 (×3): 1000 mg via INTRAVENOUS
  Filled 2019-01-01 (×5): qty 100

## 2019-01-01 MED ORDER — LACTATED RINGERS IV SOLN
INTRAVENOUS | Status: DC
  Administered 2019-01-01 (×3): via INTRAVENOUS

## 2019-01-01 MED ORDER — PROMETHAZINE HCL 25 MG/ML IJ SOLN: 6.2500 mg | INTRAMUSCULAR | Status: DC | PRN

## 2019-01-01 MED ORDER — CHLORHEXIDINE GLUCONATE CLOTH 2 % EX PADS
6.0000 | MEDICATED_PAD | Freq: Every day | CUTANEOUS | Status: DC
  Administered 2019-01-01 – 2019-01-02 (×2): 6 via TOPICAL

## 2019-01-01 MED ORDER — ORAL CARE MOUTH RINSE
15.0000 mL | Freq: Two times a day (BID) | OROMUCOSAL | Status: DC
  Administered 2019-01-01 – 2019-01-06 (×9): 15 mL via OROMUCOSAL

## 2019-01-01 MED ORDER — WATER FOR IRRIGATION, STERILE IR SOLN
Status: DC | PRN
  Administered 2019-01-01: 1000 mL

## 2019-01-01 MED ORDER — ACETAMINOPHEN 10 MG/ML IV SOLN: 1000.0000 mg | Freq: Once | INTRAVENOUS | Status: DC | PRN

## 2019-01-01 MED ORDER — PHENYLEPHRINE 40 MCG/ML (10ML) SYRINGE FOR IV PUSH (FOR BLOOD PRESSURE SUPPORT)
PREFILLED_SYRINGE | INTRAVENOUS | Status: DC | PRN
  Administered 2019-01-01 (×6): 80 ug via INTRAVENOUS

## 2019-01-01 MED ORDER — OXYCODONE HCL 5 MG PO TABS
5.0000 mg | ORAL_TABLET | ORAL | Status: DC | PRN
  Administered 2019-01-02 – 2019-01-06 (×9): 5 mg via ORAL
  Filled 2019-01-01 (×10): qty 1

## 2019-01-01 MED ORDER — LIDOCAINE 2% (20 MG/ML) 5 ML SYRINGE
INTRAMUSCULAR | Status: DC | PRN
  Administered 2019-01-01: 80 mg via INTRAVENOUS

## 2019-01-01 MED ORDER — ROCURONIUM BROMIDE 10 MG/ML (PF) SYRINGE
PREFILLED_SYRINGE | INTRAVENOUS | Status: DC | PRN
  Administered 2019-01-01: 50 mg via INTRAVENOUS
  Administered 2019-01-01: 40 mg via INTRAVENOUS
  Administered 2019-01-01: 60 mg via INTRAVENOUS
  Administered 2019-01-01: 30 mg via INTRAVENOUS
  Administered 2019-01-01: 20 mg via INTRAVENOUS

## 2019-01-01 MED ORDER — PROMETHAZINE HCL 25 MG/ML IJ SOLN
INTRAMUSCULAR | Status: AC
  Filled 2019-01-01: qty 1

## 2019-01-01 MED ORDER — MEPERIDINE HCL 50 MG/ML IJ SOLN
6.2500 mg | Freq: Once | INTRAMUSCULAR | Status: AC
  Administered 2019-01-01: 16:00:00 6.25 mg via INTRAVENOUS

## 2019-01-01 MED ORDER — LACTATED RINGERS IR SOLN
Status: DC | PRN
  Administered 2019-01-01: 1

## 2019-01-01 MED ORDER — ALVIMOPAN 12 MG PO CAPS
12.0000 mg | ORAL_CAPSULE | Freq: Two times a day (BID) | ORAL | Status: DC
  Administered 2019-01-02 – 2019-01-05 (×7): 12 mg via ORAL
  Filled 2019-01-01 (×8): qty 1

## 2019-01-01 MED ORDER — SUCCINYLCHOLINE CHLORIDE 200 MG/10ML IV SOSY
PREFILLED_SYRINGE | INTRAVENOUS | Status: DC | PRN
  Administered 2019-01-01: 120 mg via INTRAVENOUS

## 2019-01-01 MED ORDER — DEXTROSE-NACL 5-0.45 % IV SOLN
INTRAVENOUS | Status: DC
  Administered 2019-01-01 – 2019-01-05 (×6): via INTRAVENOUS

## 2019-01-01 MED ORDER — OXYCODONE HCL 5 MG/5ML PO SOLN: 5.0000 mg | Freq: Once | ORAL | Status: DC | PRN

## 2019-01-01 MED ORDER — CHLORHEXIDINE GLUCONATE CLOTH 2 % EX PADS: 6.0000 | MEDICATED_PAD | Freq: Every day | CUTANEOUS | Status: DC

## 2019-01-01 MED ORDER — BUPIVACAINE LIPOSOME 1.3 % IJ SUSP
20.0000 mL | Freq: Once | INTRAMUSCULAR | Status: AC
  Administered 2019-01-01: 14:00:00 40 mL
  Filled 2019-01-01: qty 20

## 2019-01-01 MED ORDER — DEXAMETHASONE SODIUM PHOSPHATE 10 MG/ML IJ SOLN
INTRAMUSCULAR | Status: DC | PRN
  Administered 2019-01-01: 10 mg via INTRAVENOUS

## 2019-01-01 MED ORDER — HYDROMORPHONE HCL 1 MG/ML IJ SOLN
INTRAMUSCULAR | Status: AC
  Filled 2019-01-01: qty 1

## 2019-01-01 MED ORDER — LACTATED RINGERS IV SOLN
INTRAVENOUS | Status: DC | PRN
  Administered 2019-01-01: 08:00:00 via INTRAVENOUS

## 2019-01-01 MED ORDER — PROPOFOL 10 MG/ML IV BOLUS
INTRAVENOUS | Status: DC | PRN
  Administered 2019-01-01: 150 mg via INTRAVENOUS

## 2019-01-01 MED ORDER — DOCUSATE SODIUM 100 MG PO CAPS
100.0000 mg | ORAL_CAPSULE | Freq: Two times a day (BID) | ORAL | Status: DC
  Administered 2019-01-01 – 2019-01-06 (×10): 100 mg via ORAL
  Filled 2019-01-01 (×10): qty 1

## 2019-01-01 MED ORDER — DIPHENHYDRAMINE HCL 12.5 MG/5ML PO ELIX
12.5000 mg | ORAL_SOLUTION | Freq: Four times a day (QID) | ORAL | Status: DC | PRN
  Filled 2019-01-01: qty 5

## 2019-01-01 MED ORDER — SUGAMMADEX SODIUM 200 MG/2ML IV SOLN
INTRAVENOUS | Status: DC | PRN
  Administered 2019-01-01: 500 mg via INTRAVENOUS

## 2019-01-01 SURGICAL SUPPLY — 100 items
ADH SKN CLS APL DERMABOND .7 (GAUZE/BANDAGES/DRESSINGS) ×2
AGENT HMST KT MTR STRL THRMB (HEMOSTASIS)
APL ESCP 34 STRL LF DISP (HEMOSTASIS)
APL PRP STRL LF DISP 70% ISPRP (MISCELLANEOUS) ×1
APL SWBSTK 6 STRL LF DISP (MISCELLANEOUS) ×1
APPLICATOR COTTON TIP 6 STRL (MISCELLANEOUS) ×1 IMPLANT
APPLICATOR COTTON TIP 6IN STRL (MISCELLANEOUS) ×2
APPLICATOR SURGIFLO ENDO (HEMOSTASIS) IMPLANT
BAG LAPAROSCOPIC 12 15 PORT 16 (BASKET) ×1 IMPLANT
BAG RETRIEVAL 12/15 (BASKET) ×2
BAG URO CATCHER STRL LF (MISCELLANEOUS) ×2 IMPLANT
BLADE HEX COATED 2.75 (ELECTRODE) ×2 IMPLANT
BLADE SURG SZ10 CARB STEEL (BLADE) IMPLANT
CATH SILICONE 5CC 18FR (INSTRUMENTS) ×2 IMPLANT
CELLS DAT CNTRL 66122 CELL SVR (MISCELLANEOUS) ×1 IMPLANT
CHLORAPREP W/TINT 26 (MISCELLANEOUS) ×2 IMPLANT
CLIP VESOLOCK LG 6/CT PURPLE (CLIP) ×4 IMPLANT
CLIP VESOLOCK MED LG 6/CT (CLIP) ×2 IMPLANT
CLIP VESOLOCK XL 6/CT (CLIP) ×2 IMPLANT
CLOTH BEACON ORANGE TIMEOUT ST (SAFETY) ×2 IMPLANT
COVER SURGICAL LIGHT HANDLE (MISCELLANEOUS) ×2 IMPLANT
COVER TIP SHEARS 8 DVNC (MISCELLANEOUS) ×1 IMPLANT
COVER TIP SHEARS 8MM DA VINCI (MISCELLANEOUS) ×1
COVER WAND RF STERILE (DRAPES) IMPLANT
DECANTER SPIKE VIAL GLASS SM (MISCELLANEOUS) ×2 IMPLANT
DERMABOND ADVANCED (GAUZE/BANDAGES/DRESSINGS) ×2
DERMABOND ADVANCED .7 DNX12 (GAUZE/BANDAGES/DRESSINGS) ×2 IMPLANT
DRAIN CHANNEL RND F F (WOUND CARE) ×2 IMPLANT
DRAIN PENROSE 18X1/2 LTX STRL (DRAIN) IMPLANT
DRAPE ARM DVNC X/XI (DISPOSABLE) ×4 IMPLANT
DRAPE COLUMN DVNC XI (DISPOSABLE) ×1 IMPLANT
DRAPE DA VINCI XI ARM (DISPOSABLE) ×4
DRAPE DA VINCI XI COLUMN (DISPOSABLE) ×1
ELECT REM PT RETURN 15FT ADLT (MISCELLANEOUS) ×2 IMPLANT
EVACUATOR SILICONE 100CC (DRAIN) ×2 IMPLANT
GLOVE BIO SURGEON STRL SZ 6.5 (GLOVE) ×4 IMPLANT
GLOVE BIOGEL M STRL SZ7.5 (GLOVE) ×6 IMPLANT
GOWN STRL REUS W/TWL LRG LVL3 (GOWN DISPOSABLE) ×10 IMPLANT
GOWN STRL REUS W/TWL XL LVL3 (GOWN DISPOSABLE) ×2 IMPLANT
HEMOSTAT SURGICEL 4X8 (HEMOSTASIS) ×1 IMPLANT
IRRIG SUCT STRYKERFLOW 2 WTIP (MISCELLANEOUS) ×2
IRRIGATION SUCT STRKRFLW 2 WTP (MISCELLANEOUS) ×1 IMPLANT
KIT PROCEDURE DA VINCI SI (MISCELLANEOUS)
KIT PROCEDURE DVNC SI (MISCELLANEOUS) IMPLANT
KIT TURNOVER KIT A (KITS) IMPLANT
LOOP VESSEL MAXI BLUE (MISCELLANEOUS) ×2 IMPLANT
MANIFOLD NEPTUNE II (INSTRUMENTS) ×2 IMPLANT
NDL INSUFFLATION 14GA 120MM (NEEDLE) ×1 IMPLANT
NEEDLE INSUFFLATION 14GA 120MM (NEEDLE) ×2 IMPLANT
PACK CYSTO (CUSTOM PROCEDURE TRAY) ×2 IMPLANT
PACK ROBOT UROLOGY CUSTOM (CUSTOM PROCEDURE TRAY) ×2 IMPLANT
PAD POSITIONING PINK XL (MISCELLANEOUS) ×2 IMPLANT
PORT ACCESS TROCAR AIRSEAL 12 (TROCAR) ×1 IMPLANT
PORT ACCESS TROCAR AIRSEAL 5M (TROCAR) ×1
RELOAD STAPLE 60 2.6 WHT THN (STAPLE) ×3 IMPLANT
RELOAD STAPLE 60 4.1 GRN THCK (STAPLE) ×3 IMPLANT
RELOAD STAPLER GREEN 60MM (STAPLE) ×4 IMPLANT
RELOAD STAPLER WHITE 60MM (STAPLE) ×8 IMPLANT
RETRACTOR LONRSTAR 16.6X16.6CM (MISCELLANEOUS) IMPLANT
RETRACTOR STAY HOOK 5MM (MISCELLANEOUS) IMPLANT
RETRACTOR STER APS 16.6X16.6CM (MISCELLANEOUS)
RETRACTOR WND ALEXIS 18 MED (MISCELLANEOUS) ×1 IMPLANT
RTRCTR WOUND ALEXIS 18CM MED (MISCELLANEOUS) ×2
SEAL CANN UNIV 5-8 DVNC XI (MISCELLANEOUS) ×4 IMPLANT
SEAL XI 5MM-8MM UNIVERSAL (MISCELLANEOUS) ×4
SET TRI-LUMEN FLTR TB AIRSEAL (TUBING) ×2 IMPLANT
SOLUTION ELECTROLUBE (MISCELLANEOUS) ×2 IMPLANT
SPONGE LAP 18X18 RF (DISPOSABLE) ×4 IMPLANT
SPONGE LAP 4X18 RFD (DISPOSABLE) ×2 IMPLANT
STAPLER ECHELON LONG 60 440 (INSTRUMENTS) ×2 IMPLANT
STAPLER RELOAD GREEN 60MM (STAPLE) ×8
STAPLER RELOAD WHITE 60MM (STAPLE) ×16
STENT SET URETHERAL LEFT 7FR (STENTS) ×2 IMPLANT
STENT SET URETHERAL RIGHT 7FR (STENTS) ×2 IMPLANT
SURGIFLO W/THROMBIN 8M KIT (HEMOSTASIS) IMPLANT
SUT CHROMIC 4 0 RB 1X27 (SUTURE) ×2 IMPLANT
SUT ETHILON 3 0 PS 1 (SUTURE) ×2 IMPLANT
SUT MNCRL AB 4-0 PS2 18 (SUTURE) ×4 IMPLANT
SUT PDS AB 0 CTX 36 PDP370T (SUTURE) ×8 IMPLANT
SUT PROLENE 4 0 RB 1 (SUTURE) ×2
SUT PROLENE 4-0 RB1 .5 CRCL 36 (SUTURE) IMPLANT
SUT SILK 3 0 SH 30 (SUTURE) IMPLANT
SUT SILK 3 0 SH CR/8 (SUTURE) ×2 IMPLANT
SUT VIC AB 2-0 SH 18 (SUTURE) IMPLANT
SUT VIC AB 2-0 UR5 27 (SUTURE) ×9 IMPLANT
SUT VIC AB 3-0 SH 27 (SUTURE) ×4
SUT VIC AB 3-0 SH 27X BRD (SUTURE) ×1 IMPLANT
SUT VIC AB 3-0 SH 27XBRD (SUTURE) ×1 IMPLANT
SUT VIC AB 4-0 RB1 27 (SUTURE) ×12
SUT VIC AB 4-0 RB1 27XBRD (SUTURE) ×4 IMPLANT
SUT VLOC BARB 180 ABS3/0GR12 (SUTURE) ×4
SUTURE VLOC BRB 180 ABS3/0GR12 (SUTURE) ×1 IMPLANT
SYR CONTROL 10ML LL (SYRINGE) IMPLANT
SYSTEM UROSTOMY GENTLE TOUCH (WOUND CARE) ×3 IMPLANT
TOWEL OR NON WOVEN STRL DISP B (DISPOSABLE) ×2 IMPLANT
TROCAR BLADELESS 15MM (ENDOMECHANICALS) ×2 IMPLANT
TUBING CONNECTING 10 (TUBING) IMPLANT
WATER STERILE IRR 1000ML POUR (IV SOLUTION) ×4 IMPLANT
WATER STERILE IRR 3000ML UROMA (IV SOLUTION) ×2 IMPLANT
YANKAUER SUCT BULB TIP 10FT TU (MISCELLANEOUS) ×1 IMPLANT

## 2019-01-01 NOTE — Discharge Instructions (Signed)

## 2019-01-01 NOTE — Progress Notes (Signed)
Post-op note  Subjective: The patient is doing well.  Complains of positional back pain which resolved when he moved, but now has increased abdominal pain. Not yet received pain medications.   Objective: Vital signs in last 24 hours: Temp:  [98.2 F (36.8 C)-98.9 F (37.2 C)] 98.8 F (37.1 C) (05/13 2000) Pulse Rate:  [52-90] 86 (05/13 2015) Resp:  [9-21] 10 (05/13 2015) BP: (106-168)/(52-97) 137/97 (05/13 2000) SpO2:  [93 %-100 %] 95 % (05/13 2015) Weight:  [83.5 kg-84.4 kg] 84.4 kg (05/13 1655)  Intake/Output from previous day: No intake/output data recorded. Intake/Output this shift: No intake/output data recorded.  Physical Exam:  General: Alert and oriented. Abdomen: Soft, nondistended, incisions c/d/i  Incisions: Clean and dry. GU: stoma in RLQ moist and pink with pink and blue bander stents visible. Adequate light pink urine.   Lab Results: Recent Labs    12/31/18 1157 01/01/19 1510  HGB 13.3 13.2  HCT 41.6 41.7    Assessment/Plan: POD#0   1) Continue to monitor 2) Routine post-operative care. Patient to receive prn pain medications for pain.

## 2019-01-01 NOTE — Progress Notes (Signed)
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 No overtly metastatic disease, but some borderline left ext. iliac adnopathy.   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 Still some enhancing parencheyma but relative function clearly diminished. Cr 12/2018 1.5.    Today " Darren Brady" is seen ready for cystectomy. HE had stomal marking and completed bowel prep to clear. Hgb 13. Cr stable.    Objective: Vital signs in last 24 hours: Temp:  [98.2 F (36.8 C)-99 F (37.2 C)] 98.9 F (37.2 C) (05/13 0735) Pulse Rate:  [52-73] 56 (05/13 0735) Resp:  [14-20] 18 (05/13 0735) BP: (106-133)/(52-95) 122/83 (05/13 0735) SpO2:  [95 %-100 %] 99 % (05/13 0735) Weight:  [83.5 kg] 83.5 kg (05/13 0742)    Intake/Output from previous day: No intake/output data recorded. Intake/Output this shift: No intake/output data recorded.   Physical Exam  Constitutional: He appears well-developed.  HENT:  Head: Normocephalic.  Eyes: Pupils are equal, round, and reactive to light.  Neck: Normal range of motion.  Cardiovascular: Normal rate.  Respiratory: Effort normal.  GI: Soft. RLQ stomal marking site noted.  Genitourinary:    Penis normal.  Musculoskeletal: Normal range of motion.  Neurological: He is alert.  Skin: Skin is warm.  Psychiatric: He has a normal mood and affect.   Lab Results:  Recent Labs    12/31/18 1157  WBC 6.8  HGB 13.3  HCT 41.6  PLT 277   BMET Recent Labs    12/31/18 1157  NA 138  K 4.2  CL 108  CO2 24  GLUCOSE 99  BUN 24*  CREATININE 1.48*  CALCIUM 9.2   PT/INR No results for input(s): LABPROT, INR in the last 72 hours. ABG No results  for input(s): PHART, HCO3 in the last 72 hours.  Invalid input(s): PCO2, PO2  Studies/Results: No results found.  Anti-infectives: Anti-infectives (From admission, onward)   Start     Dose/Rate Route Frequency Ordered Stop   01/01/19 0600  piperacillin-tazobactam (ZOSYN) IVPB 3.375 g     3.375 g 100 mL/hr over 30 Minutes Intravenous 30 min pre-op 12/31/18 1139 01/01/19 0805   12/31/18 1600  neomycin (MYCIFRADIN) tablet 1,000 mg     1,000 mg Oral Every 8 hours 12/31/18 1511 12/31/18 2329   12/31/18 1600  metroNIDAZOLE (FLAGYL) tablet 500 mg     500 mg Oral Every 8 hours 12/31/18 1511 12/31/18 2329   12/31/18 1128  piperacillin-tazobactam (ZOSYN) IVPB 3.375 g  Status:  Discontinued     3.375 g 100 mL/hr over 30 Minutes Intravenous 30 min pre-op 12/31/18 1128 12/31/18 1139      Assessment/Plan:  Proceed as planned with curative intent cystoprostatectomy, cysto-ICG injection, ileal conduit diversion. Risks, benefits, alternatives expected peri-op course discussed again today.   Alexis Frock 01/01/2019

## 2019-01-01 NOTE — Anesthesia Procedure Notes (Signed)
Procedure Name: Intubation Date/Time: 01/01/2019 8:37 AM Performed by: Mitzie Na, CRNA Pre-anesthesia Checklist: Patient identified, Emergency Drugs available, Suction available, Patient being monitored and Timeout performed Patient Re-evaluated:Patient Re-evaluated prior to induction Oxygen Delivery Method: Circle system utilized Preoxygenation: Pre-oxygenation with 100% oxygen Induction Type: IV induction Laryngoscope Size: Mac and 4 Grade View: Grade I Tube type: Oral Tube size: 7.5 mm Number of attempts: 1 Airway Equipment and Method: Stylet Placement Confirmation: ETT inserted through vocal cords under direct vision,  positive ETCO2 and breath sounds checked- equal and bilateral Secured at: 22 cm Tube secured with: Tape Dental Injury: Teeth and Oropharynx as per pre-operative assessment

## 2019-01-01 NOTE — Brief Op Note (Signed)
12/31/2018 - 01/01/2019  2:44 PM  PATIENT:  Darren Brady.  53 y.o. male  PRE-OPERATIVE DIAGNOSIS:  BLADDER - PROSTATE CANCER  POST-OPERATIVE DIAGNOSIS:  BLADDER - PROSTATE CANCER  PROCEDURE:  Procedure(s) with comments: ROBOT ASSISTED LAPAROSCOPIC RADICAL CYSTOPROSTATECTOMY BILATERAL PELVIC LYMPHADENECTOMY,ILEAL CONDUIT (N/A) - 6 HRS CYSTOSCOPY WITH INJECTION (N/A)  SURGEON:  Surgeon(s) and Role:    * Alexis Frock, MD - Primary  PHYSICIAN ASSISTANT:   ASSISTANTS: Debbrah Alar, PA   ANESTHESIA:   local and general  EBL:  250 mL   BLOOD ADMINISTERED:none  DRAINS: 1 - Urostomy to gravity with Rt (red) and LT (blue) bander stents; 2 - JP to bulb   LOCAL MEDICATIONS USED:  MARCAINE     SPECIMEN:  Source of Specimen:  1 - ureteral margins; 2 - pelvc lymph nodes; 3 - cystoprostatectomy  DISPOSITION OF SPECIMEN:  PATHOLOGY  COUNTS:  YES  TOURNIQUET:  * No tourniquets in log *  DICTATION: .Other Dictation: Dictation Number  [id number]  PLAN OF CARE: Admit to inpatient   PATIENT DISPOSITION:  PACU - hemodynamically stable.   Delay start of Pharmacological VTE agent (>24hrs) due to surgical blood loss or risk of bleeding: yes

## 2019-01-01 NOTE — Transfer of Care (Signed)
Immediate Anesthesia Transfer of Care Note  Patient: Darren Brady.  Procedure(s) Performed: ROBOT ASSISTED LAPAROSCOPIC RADICAL CYSTOPROSTATECTOMY BILATERAL PELVIC LYMPHADENECTOMY,ILEAL CONDUIT (N/A ) CYSTOSCOPY WITH INJECTION (N/A )  Patient Location: PACU  Anesthesia Type:General  Level of Consciousness: awake, alert , oriented and patient cooperative  Airway & Oxygen Therapy: Patient Spontanous Breathing and Patient connected to face mask oxygen  Post-op Assessment: Report given to RN, Post -op Vital signs reviewed and stable and Patient moving all extremities  Post vital signs: Reviewed and stable  Last Vitals:  Vitals Value Taken Time  BP 135/78 01/01/2019  3:00 PM  Temp    Pulse 91 01/01/2019  3:06 PM  Resp 17 01/01/2019  3:06 PM  SpO2 98 % 01/01/2019  3:06 PM  Vitals shown include unvalidated device data.  Last Pain:  Vitals:   01/01/19 1500  TempSrc:   PainSc: (P) 0-No pain         Complications: No apparent anesthesia complications

## 2019-01-01 NOTE — Anesthesia Postprocedure Evaluation (Signed)
Anesthesia Post Note  Patient: Darren Brady.  Procedure(s) Performed: ROBOT ASSISTED LAPAROSCOPIC RADICAL CYSTOPROSTATECTOMY BILATERAL PELVIC LYMPHADENECTOMY,ILEAL CONDUIT (N/A ) CYSTOSCOPY WITH INJECTION (N/A )     Patient location during evaluation: PACU Anesthesia Type: General Level of consciousness: awake and alert Pain management: pain level controlled Vital Signs Assessment: post-procedure vital signs reviewed and stable Respiratory status: spontaneous breathing, nonlabored ventilation and respiratory function stable Cardiovascular status: blood pressure returned to baseline and stable Postop Assessment: no apparent nausea or vomiting Anesthetic complications: no    Last Vitals:  Vitals:   01/01/19 1615 01/01/19 1630  BP: 122/87 139/84  Pulse: 85 76  Resp: 18 10  Temp: 37.1 C 37.1 C  SpO2: 99% 98%    Last Pain:  Vitals:   01/01/19 1630  TempSrc:   PainSc: Hutchinson E Steffen Hase

## 2019-01-02 ENCOUNTER — Encounter (HOSPITAL_COMMUNITY): Payer: Self-pay | Admitting: Urology

## 2019-01-02 LAB — BASIC METABOLIC PANEL
Anion gap: 9 (ref 5–15)
BUN: 22 mg/dL — ABNORMAL HIGH (ref 6–20)
CO2: 22 mmol/L (ref 22–32)
Calcium: 8.3 mg/dL — ABNORMAL LOW (ref 8.9–10.3)
Chloride: 106 mmol/L (ref 98–111)
Creatinine, Ser: 1.72 mg/dL — ABNORMAL HIGH (ref 0.61–1.24)
GFR calc Af Amer: 51 mL/min — ABNORMAL LOW (ref >60)
GFR calc non Af Amer: 44 mL/min — ABNORMAL LOW (ref >60)
Glucose, Bld: 149 mg/dL — ABNORMAL HIGH (ref 70–99)
Potassium: 4.4 mmol/L (ref 3.5–5.1)
Sodium: 137 mmol/L (ref 135–145)

## 2019-01-02 LAB — HEMOGLOBIN AND HEMATOCRIT, BLOOD
HCT: 37.1 % — ABNORMAL LOW (ref 39.0–52.0)
Hemoglobin: 11.7 g/dL — ABNORMAL LOW (ref 13.0–17.0)

## 2019-01-02 MED ORDER — HEPARIN SODIUM (PORCINE) 5000 UNIT/ML IJ SOLN
5000.0000 [IU] | Freq: Three times a day (TID) | INTRAMUSCULAR | Status: DC
  Administered 2019-01-02 – 2019-01-06 (×12): 5000 [IU] via SUBCUTANEOUS
  Filled 2019-01-02 (×12): qty 1

## 2019-01-02 NOTE — Progress Notes (Signed)
Urology Progress Note   1 Day Post-Op s/p radical cystectomy and ileal conduit  Subjective: NAEON. Pain controlled with pain medications. Tolerating sips of water. Not yet ambulated.    Objective: Vital signs in last 24 hours: Temp:  [98.2 F (36.8 C)-98.9 F (37.2 C)] 98.5 F (36.9 C) (05/14 0800) Pulse Rate:  [58-90] 64 (05/14 0800) Resp:  [0-21] 16 (05/14 0800) BP: (98-168)/(57-97) 106/75 (05/14 0800) SpO2:  [93 %-100 %] 96 % (05/14 0800) Weight:  [84.4 kg] 84.4 kg (05/13 1655)  Intake/Output from previous day: 05/13 0701 - 05/14 0700 In: 2052.6 [I.V.:1913.6; IV Piggyback:139] Out: 0539 [Urine:1150; Drains:380; Blood:250] Intake/Output this shift: Total I/O In: -  Out: 85 [Drains:85]  Physical Exam:  General: Alert and oriented CV: HDS, regular rate Lungs: NWOB on RA Abdomen: Soft, appropriately tender. Non distended. Incisions c/d/i.  GU: stoma in RLQ moist and pink with pink and blue bander stents visible. Adequate light pink urine.  Ext: NT, No erythema  Lab Results: Recent Labs    12/31/18 1157 01/01/19 1510 01/02/19 0254  HGB 13.3 13.2 11.7*  HCT 41.6 41.7 37.1*   BMET Recent Labs    12/31/18 1157 01/02/19 0254  NA 138 137  K 4.2 4.4  CL 108 106  CO2 24 22  GLUCOSE 99 149*  BUN 24* 22*  CREATININE 1.48* 1.72*  CALCIUM 9.2 8.3*     Studies/Results: No results found.  Assessment/Plan:  53 y.o. male s/p radical cystectomy and ileal conduit.  Overall doing well post-op.   1. Bladder cancer: s/p radical cystectomy and ileal conduit. Pathology report pending. Continue multi-modal pain regimen. Continue bander stent in conduit and JP. Start [facility] for DVT prophylaxis given post-operative status and hypercoagulable state.   2. Post-operative ileus: Tolerating sips. Continue IVF and ice chips. Await return of bowel function. Ambulate three times today.   3. Disposition: Transfer to med surg floor.     LOS: 1 day

## 2019-01-02 NOTE — Op Note (Signed)
NAME: Darren JR., Darren Brady:[id number] ACCOUNT [id number] DATE OF BIRTH:06/13/1966 FACILITY: [facility] LOCATION: [facility] PHYSICIAN:Ceaira Ernster Tresa Moore, MD  OPERATIVE REPORT  DATE OF PROCEDURE:  01/01/2019  PREOPERATIVE DIAGNOSIS:  Stage IV bladder cancer.  POSTOPERATIVE DIAGNOSIS:  Stage IV bladder cancer.  PROCEDURE: 1.  Cystoscopy with injection of indocyanine green dye. 2.  Robotic-assisted laparoscopic radical cystoprostatectomy with ileal conduit urinary diversion and bilateral pelvic lymphadenectomy.  ESTIMATED BLOOD LOSS:  250 mL.  COMPLICATIONS:  None.  ASSISTANT:  Debbrah Alar, PA-C  FINDINGS: 1.  Large sessile nodular tumor at the trigone and left bladder base by cystoscopy. 2.  No evidence of sentinel lymph nodes within the pelvis, likely due to thickness of tumor and poor dye uptake. 3.  Locally advanced, but not overtly metastatic bladder cancer, likely stage IV, with some involvement of the prostate and the peritoneal surface. 4.  Significant left hydronephrosis with large dilated ureter.  DRAINS: 1.  Jackson-Pratt drain to bulb suction. 2.  Right lower quadrant urostomy with right (red) and (left) blue bander stents.  SPECIMENS: 1.  Cystoprostatectomy. 2.  Right distal ureteral margin frozen negative. 3.  Left distal ureteral margin frozen negative. 4.  Right final ureteral margin. 5.  Left final ureteral margin. 6.  Right external iliac lymph nodes. 7.  Right obturator lymph nodes. 8.  Right internal iliac lymph nodes. 9.  Right common iliac lymph nodes. 10.  Left common iliac lymph nodes. 11.  Left external iliac lymph nodes. 12.  Left obturator lymph nodes. 13.  Left internal iliac lymph nodes.  INDICATIONS:  The patient is a very pleasant and quite vigorous 53 year old gentleman who was found on workup of abdominal pain, flank pain and hematuria to have very large volume bladder cancer, clinically stage IV with prostate involvement, not  overtly  metastatic.  He underwent transurethral resection which corroborated a predominantly squamous differentiation bladder cancer, also on the left with hydronephrosis and some left malignant obstruction.  Options discussed for management including purely  palliative protocols versus aggressive therapy with surgical extirpation with likely adjuvant chemotherapy, and he wished to proceed with the latter approach and decided on the surgery up-front approach given his malignant obstruction.  It was felt that  adjuvant chemotherapy may be more optimal as surgery will hopefully improve his renal function.  He was admitted yesterday for a bowel prep and stomal marking.  He presents today for cystoprostatectomy.  Informed consent was signed and placed in the  medical record.    PROCEDURE IN DETAIL:  The patient being identified and the procedure being robotic cystoprostatectomy with cystoscopy dye injection with ileal conduit diversion was confirmed.  Procedure time-out was confirmed.  Intravenous antibiotics were administered.   General endotracheal anesthesia was induced.  The patient was placed into a low lithotomy position.  A sterile field was created by prepping and draping his penis, perineum and proximal thighs using iodine and his infraxiphoid abdomen using  chlorhexidine gluconate after clipper shaving.  He was further fastened to the operative table using 3-inch tape over foam padding across the supraxiphoid chest.  His arms were tucked at his side using gel rolls.  The lateral aspect of the knees was  padded.  A test of steep Trendelenburg positioning was performed.  He was found to be suitably positioned.  Next, cystourethroscopy was performed with a 24-French injection scope sheath.  Inspection of anterior and posterior urethra were unremarkable.   Inspection of the urinary bladder revealed a nodular sessile minimally papillary tumor  very broad based involving the left hemibladder and trigone.   The left ureteral orifice was completely obliterated.  This is consistent with known cancer.  Next, 2 mL  of indocyanine green dye was injected across 4 submucosal blebs at the lateral aspects of the very dense tumor.  A Foley catheter was placed per urethra straight drain.  Next, a high-flow, low-pressure pneumoperitoneum was obtained using Veress technique  in the supraumbilical midline and passed the aspiration and drop test.  An 8 mm robotic camera port was then placed in location.  Laparoscopic examination of the peritoneal cavity revealed minimal loose sigmoid adhesions in the left lower quadrant;  otherwise, unremarkable.  Distal ports were placed as follows:  Right paramedian 8 mm robotic port, right far lateral 12 mm AirSeal assist port, right paramedian 15 mm assistant port site at the previously marked stomal site, left paramedian 8 mm robotic  port, left far lateral 8 mm robotic port.  Robot was docked and passed the electronic checks.  Attention was directed at limited adhesiolysis.  Loose attachments between the left pelvic sidewall and sigmoid were released, which allowed better mobility  of this area.  Inspection of the peritoneum overlying the bladder surface of the prostate area did reveal some nodularity consistent with likely involvement with tumor, but nonobvious perforation beyond this.  There were no obvious peritoneal implants or  omental implants, and the mass did appear resectable.  Attention was directed at left-sided retroperitoneal dissection.  Incision was made lateral to the left ureter from the area of the iliac vessels towards the area of the internal ring and then  superiorly approximately 10 cm above the iliac crossing.  This peritoneal flap was swept medially.  The left ureter was encountered and dissected proximally for a distance of approximately 8 cm after marking the vessel loop distally to the area of the  ureterovesical junction.  The left ureter was quite  hydronephrotic, consistent with known malignant obstruction.  The ureter was clipped proximally, ligated and oversewn distally with a running V-Loc.  Frozen section was negative for carcinoma.  This was  then tucked out of the true pelvis above the iliac vessels on the left side.  Attention was directed to the left-sided pelvic lymph node dissection.  Fibrofatty tissue with the boundaries of the left external iliac artery, pelvic sidewall, iliac bifurcation was mobilized.  Lymphostasis was achieved with cold clips, set aside,  labeled left external iliac lymph nodes.  Next, fibrofatty tissue in the boundaries of the left external iliac vein, pelvic sidewall, obturator nerve was dissected free, set aside, labeled left obturator lymph nodes.  Next, tissue overlying the left  internal iliac vessels from the area of the iliac bifurcation towards the area of the superior vesicle artery was mobilized.  This tissue was set aside, labeled as left internal iliac lymph nodes.  Finally, tissue overlying the left common iliac lymph  nodes from the aortic bifurcation and iliac bifurcation was dissected free.  Lymphostasis again achieved with cold clips, set aside, labeled left common iliac lymph nodes.  There were several borderline enlarged lymph nodes but no obvious fixed or  unresectable.  Attention was directed to the right-sided dissection.  The ileocecal junction was identified, and a silk tag suture was applied to the mesentery at approximately 14 cm proximal to this to demarcate the area of future  ileal conduit and  a single silk-tagged suture was applied distal to this to note proximal-distal orientation.  An incision was made then  lateral to the cecum and the right medial umbilical ligament from the area of the right internal ring, coursing over the iliac vessels  just lateral to the ureter and then lateral to the cecum.  This was then swept medially withperitoneal retractor.  The right ureter was  encountered.  It was marked with a vessel loop.  It was of normal caliber, unlike the left.  It was dissected  proximally for approximately 6 cm above the iliac crossing and then distally to the ureterovesical junction, which was doubly clipped and ligated with a proximal clip being a white tagged suture.  This was then tucked out of the true pelvis.  Attention  was directed to the right-sided pelvic lymphadenectomy.  It was performed as per the left side of the left external iliac, common iliac, internal iliac, obturator lymph nodes, respectively.  The area of the aortic bifurcation was inspected, and the  retroperitoneal window was developed directly anterior to this by the descending colon towards the left hemipelvis.  The left ureter was brought through this window retroperitoneally to the right hemipelvis where bilateral tagged sutures and the tagged  bowel were placed into a Hem-o-Lok clip.  Posterior dissection was then performed by making an inverse U-shaped incision at the very distal aspect of the peritoneum overlying the posterior prostate.  This plane  appeared to be away from what  appeared to be some early peritoneal invasion on the back side of the bladder.  This was developed on the plane of Denonvilliers towards the apex of the prostate.  This exposed the vascular pedicles of the bladder and prostate, which controlled using  vascular stapler, taking purposely extremely wide dissection on the left which controlled very well the bladder and prostate pedicles.  Lateral dissection was performed by sweeping the endopelvic fascia away from the lateral aspect of the prostate and  base to apex orientation.  The space of Retzius was then developed.  This exposed the dorsal venous complex, controlled using a green load stapler.  Finally, apical was performed by placing the bladder and prostate specimen on superior traction,  transecting the membranous urethra coldly for 50% of the circumference,  placing 2 extra-large Hem-o-Lok clips on the inside of the Foley catheter.  It was then transected and used as a bucket handle as the posterior circumference of the membranous  urethra was then transected.  This was completely freed up.  The cystoprostatectomy specimen was placed into an extra-large EndoCatch bag for later retrieval.  The pelvis once again was inspected.  There was no evidence of rectal violation.  Digital  rectal exam was performed with indicator glove and laparoscopic vision.  No evidence of rectal violation was noted.  Hemostasis appeared excellent.  All sponge, needle counts were correct.  A closed suction drain was brought out the previous left  lateral-most robotic port site near the peritoneal cavity.  The specimen retrieval bag string was brought through the left paramedian robotic port site, and the right ureter, left ureter distal conduit, tagged sutures were grasped with a self-locking  grasper via the 15 mm port site.  Robot was then undocked.  Specimen was retrieved by extending the previous camera port site inferiorly, erring to the left side of the umbilicus for a distance of approximately 6 cm, removing the cystoprostatectomy  specimen and setting aside for pathology.  A wound protector was then placed through this, and bilateral ureters and bowel segment were delivered through this in the operative field.  Both ureters  appeared to be of sufficient length.  Attention was  directed to the bowel harvest.  A segment of distal ileum 14 cm in length was taken into continuity using a green load stapler proximally and distally, the distal portion corresponding to the previously marked distal section.  The conduit was then  aligned to retroperitoneal orientation, and bowel-to-bowel anastomosis was performed using side-to-side anastomosis on the antimesenteric border x2, which resulted in excellent palpable anastomosis.  The free end was oversewn using running silk.  The  second layer  had imbricating running silk.  The mesenteric defect was reapproximated using interrupted silk.  The acute angle anastomosis was bolstered using interrupted silk.  The bowel anastomosis was visibly viable, palpably patent, redelivered into  the abdominal cavity.  The proximal staple line of the conduit was excluded using running Vicryl.  The distal end was removed.  Attention was directed to the ureteroileal anastomosis, the proximal end of the conduit, erring towards the mesenteric side.   A segment of bowel serosa mucosa was excised approximately 4 mm in diameter and mucosal everting sutures were applied using interrupted Vicryl.  Attention was directed at the anastomosis of the right ureter.  The right distal ureter was again transected.  Final  margin marked with a marking pen and set aside.  It was then spatulated for a distance of approximately 1 cm, and a heel stitch of interrupted Vicryl was applied.  A red-colored bander stent was then placed 26 cm to anastomosis.  A mucosa-to-mucosa  anastomosis was performed using 2 separate running suture lines of 4-0 Vicryl, which resulted in excellent reapproximation of the ureter and the proximal conduit.  The conduit was then flipped over, and the left ureter was anastomosed as per the right;  however, no spatulation was performed of the left ureter given its hydronephrotic nature, and a blue-colored stent was placed in the left side to 25cm to the anastomosis and redelivered into the abdomen.  Attention was directed to initial maturation of the stoma.   A quarter-sized diameter column of skin and fat was excised down to the level of fascia at the previously marked 15 mm port site and stomal site.  This was dilated to accommodate 3 surgeon's fingers, and the distal conduit was brought through this.  It  was then anchored to the fascia using a 4-corner anchored 2-0 Vicryl and then rosebudded using 2-0 Vicryl x4, which revealed an excellent rosebudding of the  distal stoma.  The extraction site was once again inspected.  The omentum was brought over this.  The fascia  was reapproximated using figure-of-eight PDS x5, followed by reapproximation of Scarpa's with a running Vicryl.  All incision sites were infiltrated with dilute lipolyzed Marcaine, closure of skin using subcuticular Monocryl by Dermabond, and additional  bowel mucosa to skin reapproximation was performed at the level of the conduit.  A stomal appliance was placed, and the procedure was terminated.  The patient tolerated the procedure well.  No immediate complications.  The patient was taken to  postanesthesia care in stable condition.    Please note, first assistant Debbrah Alar was crucial for all portions of the surgery today.  She provided invaluable retraction, vascular clipping vascular stapling, specimen manipulation and general first  assistance.  Please note after the injection of the membranous urethra, it was oversewn using 3-0 V-Loc.  LN/NUANCE  D:01/01/2019 T:01/01/2019 JOB:[id number]

## 2019-01-03 LAB — BASIC METABOLIC PANEL
Anion gap: 6 (ref 5–15)
BUN: 18 mg/dL (ref 6–20)
CO2: 26 mmol/L (ref 22–32)
Calcium: 8.2 mg/dL — ABNORMAL LOW (ref 8.9–10.3)
Chloride: 105 mmol/L (ref 98–111)
Creatinine, Ser: 1.4 mg/dL — ABNORMAL HIGH (ref 0.61–1.24)
GFR calc Af Amer: >60 mL/min (ref >60)
GFR calc non Af Amer: 57 mL/min — ABNORMAL LOW (ref >60)
Glucose, Bld: 111 mg/dL — ABNORMAL HIGH (ref 70–99)
Potassium: 3.7 mmol/L (ref 3.5–5.1)
Sodium: 137 mmol/L (ref 135–145)

## 2019-01-03 LAB — TYPE AND SCREEN
ABO/RH(D): A POS
Antibody Screen: NEGATIVE

## 2019-01-03 LAB — HEMOGLOBIN AND HEMATOCRIT, BLOOD
HCT: 33.2 % — ABNORMAL LOW (ref 39.0–52.0)
Hemoglobin: 10.5 g/dL — ABNORMAL LOW (ref 13.0–17.0)

## 2019-01-03 NOTE — Progress Notes (Signed)
2 Days Post-Op   Subjective/Chief Complaint:  1 - Bladder Cancer - s/p robotic cystoprostatecotmy with node dissection and conduit diversion 5/13 for clinically stage 4 bladder cancer. Admitted 5/12 for bowel prep and stomal marking. Stepdwon POD 0. Transfer to med-surg floor POD 1. Path pending.   2 - Post-OP Ileus - s/p bowel anastamosis as part of urinary diversion. Received entereg peri-op. NPO initially. Advanced to ice chips POD 1, Clears POD 2.   3 - Left Renal Atrophy / Malignant Obstruction - baseline Cr 1.5 with partial left renal atrophy pre-op.   4 - Disposition / Rehab - Pt completely independent at baseline. Wound Ostomy team helping for new urostomy teaching. PT eval pending.  Today "Darren Brady" is improving. One episode gas pains early AM that resolved with flatus.   Objective: Vital signs in last 24 hours: Temp:  [98.5 F (36.9 C)-99.6 F (37.6 C)] 98.8 F (37.1 C) (05/15 0528) Pulse Rate:  [61-79] 79 (05/15 0528) Resp:  [16-23] 16 (05/15 0528) BP: (96-114)/(51-75) 110/72 (05/15 0528) SpO2:  [93 %-97 %] 93 % (05/15 0528) Last BM Date: 01/01/19  Intake/Output from previous day: 05/14 0701 - 05/15 0700 In: 3199.3 [P.O.:600; I.V.:2254; IV Piggyback:195.3] Out: 1000 [Urine:650; Drains:350] Intake/Output this shift: No intake/output data recorded.  General appearance: alert and cooperative Eyes: negative Nose: Nares normal. Septum midline. Mucosa normal. No drainage or sinus tenderness. Throat: lips, mucosa, and tongue normal; teeth and gums normal Neck: supple, symmetrical, trachea midline Back: symmetric, no curvature. ROM normal. No CVA tenderness. Resp: non-labored on minimal [place] O2 Cardio: NL rate GI: soft, non-tender; bowel sounds normal; no masses,  no organomegaly Male genitalia: stable mild edema as expected.  Extremities: extremities normal, atraumatic, no cyanosis or edema and SCD's in place.  Pulses: 2+ and symmetric Lymph nodes: Cervical,  supraclavicular, and axillary nodes normal. Neurologic: Grossly normal Incision/Wound: recent port and extraction sites c/d/i. RLQ Urostomy pink / patent with Rt (red) and Lt (blue) bander stents and copious non-foul urine and scant mucus. JP with expected serosanguinous fluid that is non-foul.   Lab Results:  Recent Labs    12/31/18 1157  01/02/19 0254 01/03/19 0449  WBC 6.8  --   --   --   HGB 13.3   < > 11.7* 10.5*  HCT 41.6   < > 37.1* 33.2*  PLT 277  --   --   --    < > = values in this interval not displayed.   BMET Recent Labs    01/02/19 0254 01/03/19 0449  NA 137 137  K 4.4 3.7  CL 106 105  CO2 22 26  GLUCOSE 149* 111*  BUN 22* 18  CREATININE 1.72* 1.40*  CALCIUM 8.3* 8.2*   PT/INR No results for input(s): LABPROT, INR in the last 72 hours. ABG No results for input(s): PHART, HCO3 in the last 72 hours.  Invalid input(s): PCO2, PO2  Studies/Results: No results found.  Anti-infectives: Anti-infectives (From admission, onward)   Start     Dose/Rate Route Frequency Ordered Stop   01/01/19 0600  piperacillin-tazobactam (ZOSYN) IVPB 3.375 g     3.375 g 100 mL/hr over 30 Minutes Intravenous 30 min pre-op 12/31/18 1139 01/01/19 0815   12/31/18 1600  neomycin (MYCIFRADIN) tablet 1,000 mg     1,000 mg Oral Every 8 hours 12/31/18 1511 12/31/18 2329   12/31/18 1600  metroNIDAZOLE (FLAGYL) tablet 500 mg     500 mg Oral Every 8 hours 12/31/18 1511 12/31/18 2329  12/31/18 1128  piperacillin-tazobactam (ZOSYN) IVPB 3.375 g  Status:  Discontinued     3.375 g 100 mL/hr over 30 Minutes Intravenous 30 min pre-op 12/31/18 1128 12/31/18 1139      Assessment/Plan:  1 - Bladder Cancer - Doing well POD 2.   2 - Post-OP Ileus - advance to clears today.   3 - Left Renal Atrophy / Malignant Obstruction - GFR stable / slightly improved following cystectomy and diversion.   4 - Disposition / Rehab - Remain in house. Goals for DC reinforced (likely Monday or Tuesday based  on current progress). PT eval today. Will need HHRN for new urostomy teaching / supplies as well.   Alexis Frock 01/03/2019

## 2019-01-03 NOTE — Consult Note (Signed)
[facility] Nurse ostomy consult note Stoma type/location: RUQ ileal conduit Stomal assessment/size: 1 and 3/8 inches, Red, edematous, round with os at center. Two stents in place, Red = right, Blue = left. Peristomal assessment: intact in the immediate peristomal area. 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 Output: clear urine, blood tinged Ostomy pouching: 1pc.convex pouch with skin barrier ring  Education provided:  Explained role of ostomy nurse and creation of stoma  Explained stoma characteristics (budded, flush, color, texture, care) Demonstrated pouch change (cutting new barrier, measuring stoma, cleaning peristomal skin and stoma, use of barrier ring) Education on use wick in stoma to keep skin dry with pouch change Education on emptying when 1/3 to 1/2 full and how to empty Demonstrated hooking pouch to nighttime drainage bag Discussed bathing   Discussed stents and that Dr. Tresa Moore will remove in the office. Instructed to bring a spare pouch with him to the appointments.   The services of a HHRN will be needed to reinforce ostomy teaching and support patient as ostomy changes size and pouch aperture needs to be adjusted. If you agree, please order/arrange.  Answered patient questions.   Enrolled patient in [place] program: Yes/  Supplies at bedside:  5 pouches, 5 rings, adapter, bedside drainage bag.  [facility] nursing team will follow, and will remain available to this patient, the nursing and medical teams.    Next planned visit will be Monday, 5/18 by a member of the [facility] Nursing team.  If discharged prior to that, patient would benefit from [facility] and follow up with Dr. Tresa Moore in the office as  directed.  Thank you for inviting [place] to participate in the care of this nice gentleman.  Maudie Flakes, MSN, RN, [facility], [facility]  Pager# [phone]

## 2019-01-03 NOTE — Evaluation (Signed)
Physical Therapy Evaluation Patient Details Name: Darren Brady. MRN: [id number] DOB: 1965-08-27 Today's Date: 01/03/2019   History of Present Illness  Pt is a 53 year old male s/p radical cystectomy and ileal conduit due to bladder cancer  Clinical Impression  Pt admitted with above diagnosis. Pt currently with functional limitations due to the deficits listed below (see PT Problem List).  Pt will benefit from skilled PT to increase their independence and safety with mobility to allow discharge to the venue listed below.  Pt assisted with ambulating short distance in hallway and utilized RW for comfort as pt with increased abdominal/surgical sites pain with mobility.  Pt reports he has access to DME if needed upon d/c. Pt encouraged to continue ambulating with nursing staff at least 3x/day.       Follow Up Recommendations No PT follow up    Equipment Recommendations  None recommended by PT    Recommendations for Other Services       Precautions / Restrictions Precautions Precaution Comments: ileal conduit, JP drain      Mobility  Bed Mobility Overal bed mobility: Needs Assistance Bed Mobility: Supine to Sit;Sit to Supine     Supine to sit: Min guard Sit to supine: Min guard   General bed mobility comments: min/guard for drains/lines, increased time and effort due to pain  Transfers Overall transfer level: Needs assistance Equipment used: Rolling walker (2 wheeled) Transfers: Sit to/from Stand Sit to Stand: Min assist         General transfer comment: slight assist to rise and steady, cues for hand placement  Ambulation/Gait Ambulation/Gait assistance: Min guard Gait Distance (Feet): 100 Feet Assistive device: Rolling walker (2 wheeled) Gait Pattern/deviations: Step-through pattern;Decreased stride length;Trunk flexed     General Gait Details: verbal cues for RW positioning and posture  Stairs            Wheelchair Mobility    Modified Rankin  (Stroke Patients Only)       Balance                                             Pertinent Vitals/Pain Pain Assessment: 0-10 Pain Score: 6  Pain Location: surgical sites Pain Descriptors / Indicators: Discomfort;Pressure Pain Intervention(s): Monitored during session;Premedicated before session;Limited activity within patient's tolerance    Home Living Family/patient expects to be discharged to:: Private residence Living Arrangements: Spouse/significant other   Type of Home: House       Home Layout: Able to live on main level with bedroom/bathroom Home Equipment: Environmental consultant - 2 wheels      Prior Function Level of Independence: Independent               Hand Dominance        Extremity/Trunk Assessment        Lower Extremity Assessment Lower Extremity Assessment: Overall WFL for tasks assessed    Cervical / Trunk Assessment Cervical / Trunk Assessment: Normal  Communication   Communication: No difficulties  Cognition Arousal/Alertness: Awake/alert Behavior During Therapy: WFL for tasks assessed/performed Overall Cognitive Status: Within Functional Limits for tasks assessed                                        General Comments  Exercises     Assessment/Plan    PT Assessment Patient needs continued PT services  PT Problem List Decreased strength;Decreased mobility;Decreased activity tolerance;Decreased knowledge of use of DME       PT Treatment Interventions DME instruction;Functional mobility training;Gait training;Therapeutic activities;Stair training;Therapeutic exercise;Balance training;Patient/family education    PT Goals (Current goals can be found in the Care Plan section)  Acute Rehab PT Goals PT Goal Formulation: With patient Time For Goal Achievement: 01/17/19 Potential to Achieve Goals: Good    Frequency Min 3X/week   Barriers to discharge        Co-evaluation                AM-PAC PT "6 Clicks" Mobility  Outcome Measure Help needed turning from your back to your side while in a flat bed without using bedrails?: A Little Help needed moving from lying on your back to sitting on the side of a flat bed without using bedrails?: A Little Help needed moving to and from a bed to a chair (including a wheelchair)?: A Little Help needed standing up from a chair using your arms (e.g., wheelchair or bedside chair)?: A Little Help needed to walk in hospital room?: A Little Help needed climbing 3-5 steps with a railing? : A Little 6 Click Score: 18    End of Session   Activity Tolerance: Patient tolerated treatment well Patient left: in bed;with call bell/phone within reach Nurse Communication: Mobility status PT Visit Diagnosis: Difficulty in walking, not elsewhere classified (R26.2)    Time: 8329-1916 PT Time Calculation (min) (ACUTE ONLY): 18 min   Charges:   PT Evaluation $PT Eval Low Complexity: [place], PT, DPT Acute Rehabilitation Services Office: [phone] Pager: [phone]  Trena Platt 01/03/2019, 12:11 PM

## 2019-01-04 LAB — BASIC METABOLIC PANEL
Anion gap: 5 (ref 5–15)
BUN: 11 mg/dL (ref 6–20)
CO2: 27 mmol/L (ref 22–32)
Calcium: 8.3 mg/dL — ABNORMAL LOW (ref 8.9–10.3)
Chloride: 106 mmol/L (ref 98–111)
Creatinine, Ser: 1.26 mg/dL — ABNORMAL HIGH (ref 0.61–1.24)
GFR calc Af Amer: >60 mL/min (ref >60)
GFR calc non Af Amer: >60 mL/min (ref >60)
Glucose, Bld: 112 mg/dL — ABNORMAL HIGH (ref 70–99)
Potassium: 4 mmol/L (ref 3.5–5.1)
Sodium: 138 mmol/L (ref 135–145)

## 2019-01-04 LAB — HEMOGLOBIN AND HEMATOCRIT, BLOOD
HCT: 34.1 % — ABNORMAL LOW (ref 39.0–52.0)
Hemoglobin: 10.4 g/dL — ABNORMAL LOW (ref 13.0–17.0)

## 2019-01-04 NOTE — Progress Notes (Signed)
Urology Inpatient Progress Report  Bladder and Prostate Cancer  Procedure(s): ROBOT ASSISTED LAPAROSCOPIC RADICAL CYSTOPROSTATECTOMY BILATERAL PELVIC LYMPHADENECTOMY,ILEAL CONDUIT CYSTOSCOPY WITH INJECTION  3 Days Post-Op   Intv/Subj: No acute events overnight. He had a small amount of flatus.  He continues to have some crampy abdominal pain but feels like it is slightly improved.  Drain output continues to be slightly increased.  Past 2 shifts was 140/260.  He has good urine output.  Still not taking in much p.o.  Hemoglobin and creatinine are stable.  Active Problems:   Bladder cancer ([place])  Current Facility-Administered Medications  Medication Dose Route Frequency Provider Last Rate Last Dose  . alvimopan (ENTEREG) capsule 12 mg  12 mg Oral BID Debbrah Alar, PA-C   12 mg at 01/04/19 1047  . Chlorhexidine Gluconate Cloth 2 % PADS 6 each  6 each Topical Q0600 Alexis Frock, MD   6 each at 01/04/19 [phone]  . dextrose 5 %-0.45 % sodium chloride infusion   Intravenous Continuous Debbrah Alar, PA-C 75 mL/hr at 01/04/19 0200    . diphenhydrAMINE (BENADRYL) injection 12.5-25 mg  12.5-25 mg Intravenous Q6H PRN Debbrah Alar, PA-C       Or  . diphenhydrAMINE (BENADRYL) 12.5 MG/5ML elixir 12.5-25 mg  12.5-25 mg Oral Q6H PRN Dancy, Amanda, PA-C      . docusate sodium (COLACE) capsule 100 mg  100 mg Oral BID Debbrah Alar, PA-C   100 mg at 01/04/19 1046  . heparin injection 5,000 Units  5,000 Units Subcutaneous Q8H Dorothey Baseman, MD   5,000 Units at 01/04/19 0502  . HYDROmorphone (DILAUDID) injection 0.5-1 mg  0.5-1 mg Intravenous Q2H PRN Debbrah Alar, PA-C   1 mg at 01/03/19 2358  . MEDLINE mouth rinse  15 mL Mouth Rinse BID Alexis Frock, MD   15 mL at 01/03/19 1000  . mupirocin ointment (BACTROBAN) 2 % 1 application  1 application Nasal BID Alexis Frock, MD   1 application at 15/40/08 1049  . ondansetron (ZOFRAN) injection 4 mg  4 mg Intravenous Q4H PRN Dancy, Amanda, PA-C       . oxyCODONE (Oxy IR/ROXICODONE) immediate release tablet 5 mg  5 mg Oral Q4H PRN Debbrah Alar, PA-C   5 mg at 01/04/19 0459     Objective: Vital: Vitals:   01/03/19 1421 01/03/19 2059 01/04/19 0559 01/04/19 0819  BP: 107/80 114/77 108/78   Pulse: 73 64 (!) 56   Resp: 18 18 20    Temp: 99.8 F (37.7 C) 99 F (37.2 C) 98.6 F (37 C)   TempSrc: Oral Oral Oral   SpO2: 97% 95% 96% 95%  Weight:      Height:       I/Os: I/O last 3 completed shifts: In: 2570.1 [P.O.:240; I.V.:2330.1] Out: 2905 [Urine:2370; Drains:535]  Physical Exam:  General: Patient is in no apparent distress Lungs: Normal respiratory effort, chest expands symmetrically. GI: Incisions are c/d/i. The abdomen is soft and appropriately tender without mass.  Conduit is pink patent and productive of clear yellow urine.  Stents are visible. JP drain with serosanguinous drainage Ext: lower extremities symmetric  Lab Results: Recent Labs    01/02/19 0254 01/03/19 0449 01/04/19 0538  HGB 11.7* 10.5* 10.4*  HCT 37.1* 33.2* 34.1*   Recent Labs    01/02/19 0254 01/03/19 0449 01/04/19 0538  NA 137 137 138  K 4.4 3.7 4.0  CL 106 105 106  CO2 22 26 27   GLUCOSE 149* 111* 112*  BUN 22* 18 11  CREATININE 1.72* 1.40* 1.26*  CALCIUM 8.3* 8.2* 8.3*   No results for input(s): LABPT, INR in the last 72 hours. No results for input(s): LABURIN in the last 72 hours. Results for orders placed or performed during the hospital encounter of 12/31/18  SARS Coronavirus 2 (CEPHEID - Performed in [place] hospital lab), Hosp Order     Status: None   Collection Time: 12/31/18  4:09 PM  Result Value Ref Range Status   SARS Coronavirus 2 NEGATIVE NEGATIVE Final    Comment: (NOTE) If result is NEGATIVE SARS-CoV-2 target nucleic acids are NOT DETECTED. The SARS-CoV-2 RNA is generally detectable in upper and lower  respiratory specimens during the acute phase of infection. The lowest  concentration of SARS-CoV-2 viral  copies this assay can detect is 250  copies / mL. A negative result does not preclude SARS-CoV-2 infection  and should not be used as the sole basis for treatment or other  patient management decisions.  A negative result may occur with  improper specimen collection / handling, submission of specimen other  than nasopharyngeal swab, presence of viral mutation(s) within the  areas targeted by this assay, and inadequate number of viral copies  (<250 copies / mL). A negative result must be combined with clinical  observations, patient history, and epidemiological information. If result is POSITIVE SARS-CoV-2 target nucleic acids are DETECTED. The SARS-CoV-2 RNA is generally detectable in upper and lower  respiratory specimens dur ing the acute phase of infection.  Positive  results are indicative of active infection with SARS-CoV-2.  Clinical  correlation with patient history and other diagnostic information is  necessary to determine patient infection status.  Positive results do  not rule out bacterial infection or co-infection with other viruses. If result is PRESUMPTIVE POSTIVE SARS-CoV-2 nucleic acids MAY BE PRESENT.   A presumptive positive result was obtained on the submitted specimen  and confirmed on repeat testing.  While 2019 novel coronavirus  (SARS-CoV-2) nucleic acids may be present in the submitted sample  additional confirmatory testing may be necessary for epidemiological  and / or clinical management purposes  to differentiate between  SARS-CoV-2 and other Sarbecovirus currently known to infect humans.  If clinically indicated additional testing with an alternate test  methodology [phone]) is advised. The SARS-CoV-2 RNA is generally  detectable in upper and lower respiratory sp ecimens during the acute  phase of infection. The expected result is Negative. Fact Sheet for Patients:  StrictlyIdeas.no Fact Sheet for Healthcare  Providers: BankingDealers.co.za This test is not yet approved or cleared by the [place] FDA and has been authorized for detection and/or diagnosis of SARS-CoV-2 by FDA under an Emergency Use Authorization (EUA).  This EUA will remain in effect (meaning this test can be used) for the duration of the COVID-19 declaration under Section 564(b)(1) of the Act, 21 U.S.C. section 360bbb-3(b)(1), unless the authorization is terminated or revoked sooner. Performed at [facility], [place] [address] [place], [place] [address]   Surgical PCR screen     Status: Abnormal   Collection Time: 12/31/18  4:09 PM  Result Value Ref Range Status   MRSA, PCR NEGATIVE NEGATIVE Final   Staphylococcus aureus POSITIVE (A) NEGATIVE Final    Comment: (NOTE) The Xpert SA Assay (FDA approved for NASAL specimens in patients 23 years of age and older), is one component of a comprehensive surveillance program. It is not intended to diagnose infection nor to guide or monitor treatment. Performed at [facility],  [place] [address] [place], [place] [address]     Studies/Results: No results found.  Assessment: Bladder cancer Procedure(s): ROBOT ASSISTED LAPAROSCOPIC RADICAL CYSTOPROSTATECTOMY BILATERAL PELVIC LYMPHADENECTOMY,ILEAL CONDUIT CYSTOSCOPY WITH INJECTION, 3 Days Post-Op  doing well.  Plan: Continue half maintenance IV fluids until good oral intake adVance to full liquid diet A.m. labs Continue JP drain Continue heparin for DVT prophylaxis Out of bed/ambulate/incentive spirometry   Link Snuffer, MD Urology 01/04/2019, 11:05 AM

## 2019-01-05 LAB — BASIC METABOLIC PANEL
Anion gap: 8 (ref 5–15)
BUN: 12 mg/dL (ref 6–20)
CO2: 26 mmol/L (ref 22–32)
Calcium: 8.8 mg/dL — ABNORMAL LOW (ref 8.9–10.3)
Chloride: 108 mmol/L (ref 98–111)
Creatinine, Ser: 1.26 mg/dL — ABNORMAL HIGH (ref 0.61–1.24)
GFR calc Af Amer: >60 mL/min (ref >60)
GFR calc non Af Amer: >60 mL/min (ref >60)
Glucose, Bld: 117 mg/dL — ABNORMAL HIGH (ref 70–99)
Potassium: 3.8 mmol/L (ref 3.5–5.1)
Sodium: 142 mmol/L (ref 135–145)

## 2019-01-05 LAB — CREATININE, FLUID (PLEURAL, PERITONEAL, JP DRAINAGE): Creat, Fluid: 1.1 mg/dL

## 2019-01-05 LAB — HEMOGLOBIN AND HEMATOCRIT, BLOOD
HCT: 34.1 % — ABNORMAL LOW (ref 39.0–52.0)
Hemoglobin: 10.3 g/dL — ABNORMAL LOW (ref 13.0–17.0)

## 2019-01-05 NOTE — Progress Notes (Signed)
Urology Inpatient Progress Report  Bladder and Prostate Cancer  Procedure(s): ROBOT ASSISTED LAPAROSCOPIC RADICAL CYSTOPROSTATECTOMY BILATERAL PELVIC LYMPHADENECTOMY,ILEAL CONDUIT CYSTOSCOPY WITH INJECTION  4 Days Post-Op   Intv/Subj: No acute events overnight. Patient is without complaint.  Active Problems:   Bladder cancer ([place])  Current Facility-Administered Medications  Medication Dose Route Frequency Provider Last Rate Last Dose  . alvimopan (ENTEREG) capsule 12 mg  12 mg Oral BID Debbrah Alar, PA-C   12 mg at 01/05/19 1008  . Chlorhexidine Gluconate Cloth 2 % PADS 6 each  6 each Topical Q0600 Alexis Frock, MD   6 each at 01/04/19 [phone]  . diphenhydrAMINE (BENADRYL) injection 12.5-25 mg  12.5-25 mg Intravenous Q6H PRN Debbrah Alar, PA-C       Or  . diphenhydrAMINE (BENADRYL) 12.5 MG/5ML elixir 12.5-25 mg  12.5-25 mg Oral Q6H PRN Dancy, Amanda, PA-C      . docusate sodium (COLACE) capsule 100 mg  100 mg Oral BID Debbrah Alar, PA-C   100 mg at 01/05/19 1008  . heparin injection 5,000 Units  5,000 Units Subcutaneous Q8H Dorothey Baseman, MD   5,000 Units at 01/05/19 [phone]  . HYDROmorphone (DILAUDID) injection 0.5-1 mg  0.5-1 mg Intravenous Q2H PRN Debbrah Alar, PA-C   1 mg at 01/03/19 2358  . MEDLINE mouth rinse  15 mL Mouth Rinse BID Alexis Frock, MD   15 mL at 01/05/19 1008  . mupirocin ointment (BACTROBAN) 2 % 1 application  1 application Nasal BID Alexis Frock, MD   1 application at 06/06/50 1008  . ondansetron (ZOFRAN) injection 4 mg  4 mg Intravenous Q4H PRN Debbrah Alar, PA-C      . oxyCODONE (Oxy IR/ROXICODONE) immediate release tablet 5 mg  5 mg Oral Q4H PRN Debbrah Alar, PA-C   5 mg at 01/04/19 2107     Objective: Vital: Vitals:   01/04/19 0819 01/04/19 1522 01/04/19 1954 01/05/19 0607  BP:  102/69 115/72 114/77  Pulse:  70 74 61  Resp:  16 18 18   Temp:  99.3 F (37.4 C) 98.9 F (37.2 C) 98.5 F (36.9 C)  TempSrc:  Oral Oral Oral  SpO2: 95%  96% 97% 96%  Weight:      Height:       I/Os: I/O last 3 completed shifts: In: 3635.4 [P.O.:240; I.V.:3395.4] Out: 0258 [Urine:4195; Drains:705]  Physical Exam:  General: Patient is in no apparent distress Lungs: Normal respiratory effort, chest expands symmetrically. GI: Incisions are c/d/i. The abdomen is soft and appropriately tender without mass.  Conduit is pink patent and productive of clear yellow urine.  Stents are visible. JP drain with serosanguinous drainage Ext: lower extremities symmetric  Lab Results: Recent Labs    01/03/19 0449 01/04/19 0538 01/05/19 0347  HGB 10.5* 10.4* 10.3*  HCT 33.2* 34.1* 34.1*   Recent Labs    01/03/19 0449 01/04/19 0538 01/05/19 0347  NA 137 138 142  K 3.7 4.0 3.8  CL 105 106 108  CO2 26 27 26   GLUCOSE 111* 112* 117*  BUN 18 11 12   CREATININE 1.40* 1.26* 1.26*  CALCIUM 8.2* 8.3* 8.8*   No results for input(s): LABPT, INR in the last 72 hours. No results for input(s): LABURIN in the last 72 hours. Results for orders placed or performed during the hospital encounter of 12/31/18  SARS Coronavirus 2 (CEPHEID - Performed in [facility] hospital lab), Hosp Order     Status: None   Collection Time: 12/31/18  4:09 PM  Result Value  Ref Range Status   SARS Coronavirus 2 NEGATIVE NEGATIVE Final    Comment: (NOTE) If result is NEGATIVE SARS-CoV-2 target nucleic acids are NOT DETECTED. The SARS-CoV-2 RNA is generally detectable in upper and lower  respiratory specimens during the acute phase of infection. The lowest  concentration of SARS-CoV-2 viral copies this assay can detect is 250  copies / mL. A negative result does not preclude SARS-CoV-2 infection  and should not be used as the sole basis for treatment or other  patient management decisions.  A negative result may occur with  improper specimen collection / handling, submission of specimen other  than nasopharyngeal swab, presence of viral mutation(s) within the  areas  targeted by this assay, and inadequate number of viral copies  (<250 copies / mL). A negative result must be combined with clinical  observations, patient history, and epidemiological information. If result is POSITIVE SARS-CoV-2 target nucleic acids are DETECTED. The SARS-CoV-2 RNA is generally detectable in upper and lower  respiratory specimens dur ing the acute phase of infection.  Positive  results are indicative of active infection with SARS-CoV-2.  Clinical  correlation with patient history and other diagnostic information is  necessary to determine patient infection status.  Positive results do  not rule out bacterial infection or co-infection with other viruses. If result is PRESUMPTIVE POSTIVE SARS-CoV-2 nucleic acids MAY BE PRESENT.   A presumptive positive result was obtained on the submitted specimen  and confirmed on repeat testing.  While 2019 novel coronavirus  (SARS-CoV-2) nucleic acids may be present in the submitted sample  additional confirmatory testing may be necessary for epidemiological  and / or clinical management purposes  to differentiate between  SARS-CoV-2 and other Sarbecovirus currently known to infect humans.  If clinically indicated additional testing with an alternate test  methodology [phone]) is advised. The SARS-CoV-2 RNA is generally  detectable in upper and lower respiratory sp ecimens during the acute  phase of infection. The expected result is Negative. Fact Sheet for Patients:  StrictlyIdeas.no Fact Sheet for Healthcare Providers: BankingDealers.co.za This test is not yet approved or cleared by the [place] FDA and has been authorized for detection and/or diagnosis of SARS-CoV-2 by FDA under an Emergency Use Authorization (EUA).  This EUA will remain in effect (meaning this test can be used) for the duration of the COVID-19 declaration under Section 564(b)(1) of the Act, 21 U.S.C. section  360bbb-3(b)(1), unless the authorization is terminated or revoked sooner. Performed at [facility], [place] [address] [place], [place] [address]   Surgical PCR screen     Status: Abnormal   Collection Time: 12/31/18  4:09 PM  Result Value Ref Range Status   MRSA, PCR NEGATIVE NEGATIVE Final   Staphylococcus aureus POSITIVE (A) NEGATIVE Final    Comment: (NOTE) The Xpert SA Assay (FDA approved for NASAL specimens in patients 30 years of age and older), is one component of a comprehensive surveillance program. It is not intended to diagnose infection nor to guide or monitor treatment. Performed at [facility], [place] [address] [place], [place] [address]     Studies/Results: No results found.  Assessment: Bladder cancer  Procedure(s): ROBOT ASSISTED LAPAROSCOPIC RADICAL CYSTOPROSTATECTOMY BILATERAL PELVIC LYMPHADENECTOMY,ILEAL CONDUIT CYSTOSCOPY WITH INJECTION, 4 Days Post-Op  doing well.  Plan: Discontinue IV fluids Advance to general diet Obtain JP creatinine given persistent elevated JP fluid Out of bed/incentive spirometry/physical therapy Continue heparin for DVT prophylaxis   Link Snuffer, MD Urology 01/05/2019, 11:23 AM

## 2019-01-05 NOTE — Plan of Care (Signed)
  Problem: Health Behavior/Discharge Planning: Goal: Ability to manage health-related needs will improve Outcome: Progressing   Problem: Clinical Measurements: Goal: Ability to maintain clinical measurements within normal limits will improve Outcome: Progressing Goal: Will remain free from infection Outcome: Progressing Goal: Diagnostic test results will improve Outcome: Progressing Goal: Respiratory complications will improve Outcome: Progressing Goal: Cardiovascular complication will be avoided Outcome: Progressing   Problem: Activity: Goal: Risk for activity intolerance will decrease Outcome: Progressing   Problem: Nutrition: Goal: Adequate nutrition will be maintained Outcome: Progressing   Problem: Elimination: Goal: Will not experience complications related to bowel motility Outcome: Progressing Goal: Will not experience complications related to urinary retention Outcome: Progressing   Problem: Pain Managment: Goal: General experience of comfort will improve Outcome: Progressing   Problem: Skin Integrity: Goal: Risk for impaired skin integrity will decrease Outcome: Progressing   Problem: Education: Goal: Required Educational Video(s) Outcome: Progressing   Problem: Clinical Measurements: Goal: Postoperative complications will be avoided or minimized Outcome: Progressing   Problem: Skin Integrity: Goal: Demonstration of wound healing without infection will improve Outcome: Progressing

## 2019-01-06 LAB — BASIC METABOLIC PANEL WITH GFR
Anion gap: 10 (ref 5–15)
BUN: 16 mg/dL (ref 6–20)
CO2: 23 mmol/L (ref 22–32)
Calcium: 8.7 mg/dL — ABNORMAL LOW (ref 8.9–10.3)
Chloride: 107 mmol/L (ref 98–111)
Creatinine, Ser: 1.35 mg/dL — ABNORMAL HIGH (ref 0.61–1.24)
GFR calc Af Amer: >60 mL/min
GFR calc non Af Amer: 59 mL/min — ABNORMAL LOW
Glucose, Bld: 101 mg/dL — ABNORMAL HIGH (ref 70–99)
Potassium: 4.1 mmol/L (ref 3.5–5.1)
Sodium: 140 mmol/L (ref 135–145)

## 2019-01-06 LAB — HEMOGLOBIN AND HEMATOCRIT, BLOOD
HCT: 35 % — ABNORMAL LOW (ref 39.0–52.0)
Hemoglobin: 11.1 g/dL — ABNORMAL LOW (ref 13.0–17.0)

## 2019-01-06 MED ORDER — OXYCODONE-ACETAMINOPHEN 5-325 MG PO TABS: 1.0000 | ORAL_TABLET | Freq: Four times a day (QID) | ORAL | 0 refills | Status: AC | PRN

## 2019-01-06 NOTE — Progress Notes (Signed)
Physical Therapy Treatment Patient Details Name: Darren Brady. MRN: [id number] DOB: 1966/03/17 Today's Date: 01/06/2019    History of Present Illness Pt is a 53 year old male s/p radical cystectomy and ileal conduit due to bladder cancer    PT Comments    Pt sitting on EOB at entrance and willing to participate.  No reports of pain, does report some pressure over surgical site.  Pt able to stand and ambulate in room without AD safely.  Gait training complete without AD and no LOB episodes, just slow labored cadence due to weakness.  Pt stated he plans on returning home today, reports 2 steps to get inside.  Stair training complete with step to pattern and use of 1 HR.  Pt slow cadence but safe with mechanics.  EOS pt left in chair with call bell wtihin reach and RN aware of status.    Follow Up Recommendations  No PT follow up     Equipment Recommendations  None recommended by PT    Recommendations for Other Services       Precautions / Restrictions      Mobility  Bed Mobility               General bed mobility comments: pt sitting on EOB at entrance  Transfers Overall transfer level: Independent   Transfers: Sit to/from Stand Sit to Stand: Supervision         General transfer comment: cueing for handplacement, pt able to demonstrate safe mechanics without A  Ambulation/Gait Ambulation/Gait assistance: Min guard Gait Distance (Feet): 125 Feet Assistive device: None Gait Pattern/deviations: Step-through pattern;Decreased stride length;Trunk flexed     General Gait Details: Ambulated without AD, no LOB    Stairs Stairs: Yes Stairs assistance: Supervision Stair Management: One rail Right Number of Stairs: 4 General stair comments: step to pattern with safe mechanics and use of 1 HR   Wheelchair Mobility    Modified Rankin (Stroke Patients Only)       Balance                                            Cognition  Arousal/Alertness: Awake/alert Behavior During Therapy: WFL for tasks assessed/performed Overall Cognitive Status: Within Functional Limits for tasks assessed                                        Exercises General Exercises - Lower Extremity Hip ABduction/ADduction: Both;5 reps;Strengthening;Standing Toe Raises: Both;10 reps;Standing(with 1 hand assistance on counter) Heel Raises: Both;10 reps;Standing(with 1 hand assistance)    General Comments        Pertinent Vitals/Pain Pain Assessment: No/denies pain Pain Location: no pain, just pressure from surgical site Pain Descriptors / Indicators: Pressure Pain Intervention(s): Monitored during session(did not limit activity)    Home Living                      Prior Function            PT Goals (current goals can now be found in the care plan section)      Frequency    Min 3X/week      PT Plan      Co-evaluation  AM-PAC PT "6 Clicks" Mobility   Outcome Measure  Help needed turning from your back to your side while in a flat bed without using bedrails?: A Little Help needed moving from lying on your back to sitting on the side of a flat bed without using bedrails?: A Little Help needed moving to and from a bed to a chair (including a wheelchair)?: A Little Help needed standing up from a chair using your arms (e.g., wheelchair or bedside chair)?: A Little Help needed to walk in hospital room?: A Little Help needed climbing 3-5 steps with a railing? : A Little 6 Click Score: 18    End of Session   Activity Tolerance: Patient tolerated treatment well Patient left: in chair;with call bell/phone within reach;with chair alarm set(RN aware of status) Nurse Communication: Mobility status PT Visit Diagnosis: Difficulty in walking, not elsewhere classified (R26.2)     Time: 5974-1638 PT Time Calculation (min) (ACUTE ONLY): 18 min  Charges:  $Therapeutic Activity: 8-22  mins                     [address], LPTA; [place]   Aldona Lento 01/06/2019, 5:12 PM

## 2019-01-06 NOTE — Discharge Summary (Signed)
[facility] Discharge Summary  Admit date: 12/31/2018  Discharge date and time: 01/06/19   Discharge to: Home  Discharge Service: Urology  Discharge Attending Physician: Alexis Frock, MD  Discharge  Diagnoses: Bladder Cancer  Secondary Diagnosis: Active Problems:   Bladder cancer ([place])   OR Procedures: Procedure(s): ROBOT ASSISTED LAPAROSCOPIC RADICAL CYSTOPROSTATECTOMY BILATERAL PELVIC LYMPHADENECTOMY,ILEAL CONDUIT CYSTOSCOPY WITH INJECTION 01/01/2019   Ancillary Procedures: None   Discharge Day Services: The patient was seen and examined by the Urology team both in the morning and immediately prior to discharge.  Vital signs and laboratory values were stable and within normal limits.  The physical exam was benign and unchanged and all surgical wounds were examined.  Discharge instructions were explained and all questions answered.  Subjective  No acute events overnight. Pain Controlled. No fever or chills.  Objective No data found. Total I/O In: 120 [P.O.:120] Out: 50 [Urine:50]  General Appearance:        No acute distress Lungs:                       Normal work of breathing on room air Heart:                                Regular rate and rhythm Abdomen:                         Soft, non-tender, non-distended, stoma in RLQ - moist and pink with two bander stents visible, draining clear yellow urine Extremities:                      Warm and well perfused   Hospital Course:  The patient underwent robot-assisted laparoscopic radical cystoprostatectomy with pelvic lymph node dissection and ileal conduit on 01/01/2019.  The patient tolerated the procedure well, was extubated in the OR, and afterwards was taken to the PACU for routine post-surgical care. When stable the patient was transferred to the step-down unit. The patient did well postoperatively. On POD1, he was transferred to the floor. The patient's diet was slowly advanced (ice chips on POD1, CLD on POD3,  regular diet POD4) and at the time of discharge was tolerating a regular diet. The patient was discharged home 5 Days Post-Op, at which point was tolerating a regular solid diet, draining urine through ileal conduit, have adequate pain control with P.O. pain medication, and could ambulate without difficulty. He had ROBF prior to discharge with BM on POD4. JP creatinine was negative for a urine leak and JP was removed prior to discharge. The patient will follow up with [place] for post op check.   PT evaluated the patient and recommended no additional services. [place] nurse consulted and educated the patient on appropriate care of his ileal conduit. Home health was arranged prior to discharge. Pathology pending at time of discharge.   Condition at Discharge: Improved  Discharge Medications:  Allergies as of 01/06/2019   No Known Allergies     Medication List    STOP taking these medications   oxyCODONE-acetaminophen 5-325 MG tablet Commonly known as:  Percocet     TAKE these medications   tamsulosin 0.4 MG Caps capsule Commonly known as:  FLOMAX Take 0.4 mg by mouth at bedtime.   Testosterone 20.25 MG/ACT (1.62%) Gel Apply 1 Pump topically daily. Apply 1 pump to shoulder areas

## 2019-01-06 NOTE — Consult Note (Addendum)
[place] Nurse ostomy consult note Stoma type/location: Urostomy stoma is red and viable, above slightly skin level Stomal assessment/size: 1 3/8 inches Peristomal assessment:  Intact skin surrounding Output: mod amt cloudy yellow urine, 2 stints intact Ostomy pouching: 1pc.  Previously related medical adhesive related skin injury to 3:00 o'clock of pouch, .8X.2X.1cm, pink and moist, previously had hydrocolloid applied.  Applied foam dressing to protect and promote healing.  Education provided: Pt assisted with pouch application, using convex pouch and barrier ring.  He is able to open and close to empty and attach and release from bedside drainage bag.  Reviewed pouching routines and ordering supplies.  There are 5 sets of barrier rings and pouches at the bedside, also a new drainage bag, adapter, and educational materials Enrolled patient in [place] program: Yes, previously Julien Girt MSN, [place], [place], [employer], [place]

## 2019-01-06 NOTE — TOC Initial Note (Addendum)
Transition of Care (TOC) - Initial/Assessment Note    Patient Details  Name: Darren Brady. MRN: [id number] Date of Birth: 1966/02/16  Transition of Care [facility]) CM/SW Contact:    Purcell Mouton, RN Phone Number: 01/06/2019, 4:45 PM  Clinical Narrative:  Pt transferred from ICU to 4W. Bladder Cancer. Robot assisted lap Radical Cystoprostatectomy Bilateral Pelvic Lymphadenectomy, Ileal Conduit cystoscopy.               Expected Discharge Plan: [place] Barriers to Discharge: No Barriers Identified   Patient Goals and CMS Choice Patient states their goals for this hospitalization and ongoing recovery are:: Ready to go home   Choice offered to / list presented to : Patient  Expected Discharge Plan and Services Expected Discharge Plan: [place]   Discharge Planning Services: CM Consult   Living arrangements for the past 2 months: Single Family Home Expected Discharge Date: 01/06/19                         HH Arranged: RN University Agency: [place] ([place]) Date HH Agency Contacted: 01/05/19   Representative spoke with at [place]: Santiago Glad  Prior Living Arrangements/Services Living arrangements for the past 2 months: [place] Lives with:: Spouse Patient language and need for interpreter reviewed:: No Do you feel safe going back to the place where you live?: Yes          Current home services: Home RN    Activities of Daily Living Home Assistive Devices/Equipment: Eyeglasses ADL Screening (condition at time of admission) Patient's cognitive ability adequate to safely complete daily activities?: Yes Is the patient deaf or have difficulty hearing?: No Does the patient have difficulty seeing, even when wearing glasses/contacts?: No Does the patient have difficulty concentrating, remembering, or making decisions?: No Patient able to express need for assistance with ADLs?: Yes Does the patient have  difficulty dressing or bathing?: No Independently performs ADLs?: Yes (appropriate for developmental age) Does the patient have difficulty walking or climbing stairs?: No Weakness of Legs: None Weakness of Arms/Hands: None  Permission Sought/Granted Permission sought to share information with : Case Manager                Emotional Assessment Appearance:: Appears stated age   Affect (typically observed): Accepting, Calm, Pleasant Orientation: : Oriented to Self, Oriented to Place, Oriented to  Time      Admission diagnosis:  Bladder and Prostate Cancer Patient Active Problem List   Diagnosis Date Noted  . Bladder cancer ([place]) 01/01/2019  . Hand eczema 12/17/2013  . Allergic rhinitis, cause unspecified 06/19/2011  . Routine general medical examination at a health care facility 06/12/2011  . HYPERTROPHY PROSTATE W/UR OBST & OTH LUTS 08/10/2009  . HYPOGONADISM 05/28/2009  . GERD 05/28/2009   PCP:  Aura Dials, MD Pharmacy:   [id number] - [place], [place] [address] [place] [address] Phone: [phone] Fax: [phone]  [place], [place] [address] Phone: [phone] Fax: [phone]     Social Determinants of Health (SDOH) Interventions    Readmission Risk Interventions No flowsheet data found.

## 2019-01-06 NOTE — Progress Notes (Signed)
Spoke with pt concerning HH. [facility] was [facility]. Referral was given.

## 2019-03-11 ENCOUNTER — Inpatient Hospital Stay: Payer: BC Managed Care – PPO | Attending: Oncology | Admitting: Oncology

## 2019-05-15 ENCOUNTER — Other Ambulatory Visit: Payer: Self-pay

## 2019-05-15 ENCOUNTER — Other Ambulatory Visit (HOSPITAL_COMMUNITY): Payer: Self-pay | Admitting: Urology

## 2019-05-15 ENCOUNTER — Ambulatory Visit (HOSPITAL_COMMUNITY)
Admission: RE | Admit: 2019-05-15 | Discharge: 2019-05-15 | Disposition: A | Discharge status: Home / Self Care | Payer: BC Managed Care – PPO | Source: Ambulatory Visit | Attending: Urology | Admitting: Urology

## 2019-05-15 DIAGNOSIS — C670 Malignant neoplasm of trigone of bladder: Secondary | ICD-10-CM

## 2019-10-05 ENCOUNTER — Other Ambulatory Visit: Payer: Self-pay | Admitting: Urology

## 2020-04-29 ENCOUNTER — Emergency Department (HOSPITAL_COMMUNITY)
Admission: EM | Admit: 2020-04-29 | Discharge: 2020-04-29 | Disposition: A | Discharge status: Home / Self Care | Payer: BC Managed Care – PPO | Attending: Emergency Medicine | Admitting: Emergency Medicine

## 2020-04-29 ENCOUNTER — Encounter (HOSPITAL_COMMUNITY): Payer: Self-pay | Admitting: Emergency Medicine

## 2020-04-29 ENCOUNTER — Other Ambulatory Visit: Payer: Self-pay

## 2020-04-29 ENCOUNTER — Emergency Department (HOSPITAL_COMMUNITY): Payer: BC Managed Care – PPO

## 2020-04-29 DIAGNOSIS — R109 Unspecified abdominal pain: Secondary | ICD-10-CM | POA: Diagnosis present

## 2020-04-29 DIAGNOSIS — Z20822 Contact with and (suspected) exposure to covid-19: Secondary | ICD-10-CM | POA: Insufficient documentation

## 2020-04-29 DIAGNOSIS — Z79899 Other long term (current) drug therapy: Secondary | ICD-10-CM | POA: Insufficient documentation

## 2020-04-29 DIAGNOSIS — Z8651 Personal history of combat and operational stress reaction: Secondary | ICD-10-CM | POA: Insufficient documentation

## 2020-04-29 DIAGNOSIS — K5651 Intestinal adhesions [bands], with partial obstruction: Secondary | ICD-10-CM | POA: Diagnosis not present

## 2020-04-29 HISTORY — DX: Malignant (primary) neoplasm, unspecified: C80.1

## 2020-04-29 LAB — COMPREHENSIVE METABOLIC PANEL
ALT: 20 U/L (ref 0–44)
AST: 18 U/L (ref 15–41)
Albumin: 4.2 g/dL (ref 3.5–5.0)
Alkaline Phosphatase: 90 U/L (ref 38–126)
Anion gap: 10 (ref 5–15)
BUN: 21 mg/dL — ABNORMAL HIGH (ref 6–20)
CO2: 26 mmol/L (ref 22–32)
Calcium: 10 mg/dL (ref 8.9–10.3)
Chloride: 104 mmol/L (ref 98–111)
Creatinine, Ser: 1.31 mg/dL — ABNORMAL HIGH (ref 0.61–1.24)
GFR calc Af Amer: >60 mL/min (ref >60)
GFR calc non Af Amer: >60 mL/min (ref >60)
Glucose, Bld: 95 mg/dL (ref 70–99)
Potassium: 3.8 mmol/L (ref 3.5–5.1)
Sodium: 140 mmol/L (ref 135–145)
Total Bilirubin: 0.3 mg/dL (ref 0.3–1.2)
Total Protein: 7.4 g/dL (ref 6.5–8.1)

## 2020-04-29 LAB — CBC
HCT: 36.6 % — ABNORMAL LOW (ref 39.0–52.0)
Hemoglobin: 11.5 g/dL — ABNORMAL LOW (ref 13.0–17.0)
MCH: 26.9 pg (ref 26.0–34.0)
MCHC: 31.4 g/dL (ref 30.0–36.0)
MCV: 85.7 fL (ref 80.0–100.0)
Platelets: 282 10*3/uL (ref 150–400)
RBC: 4.27 MIL/uL (ref 4.22–5.81)
RDW: 13.8 % (ref 11.5–15.5)
WBC: 6.5 10*3/uL (ref 4.0–10.5)
nRBC: 0 % (ref 0.0–0.2)

## 2020-04-29 LAB — URINALYSIS, ROUTINE W REFLEX MICROSCOPIC
Bacteria, UA: NONE SEEN
Bilirubin Urine: NEGATIVE
Glucose, UA: NEGATIVE mg/dL
Ketones, ur: NEGATIVE mg/dL
Nitrite: POSITIVE — AB
Protein, ur: 100 mg/dL — AB
Specific Gravity, Urine: 1.013 (ref 1.005–1.030)
pH: 8 (ref 5.0–8.0)

## 2020-04-29 LAB — SARS CORONAVIRUS 2 BY RT PCR (HOSPITAL ORDER, PERFORMED IN ~~LOC~~ HOSPITAL LAB): SARS Coronavirus 2: NEGATIVE

## 2020-04-29 LAB — LIPASE, BLOOD: Lipase: 37 U/L (ref 11–51)

## 2020-04-29 MED ORDER — IOHEXOL 300 MG/ML  SOLN
100.0000 mL | Freq: Once | INTRAMUSCULAR | Status: AC | PRN
  Administered 2020-04-29: 100 mL via INTRAVENOUS

## 2020-04-29 MED ORDER — ONDANSETRON HCL 4 MG/2ML IJ SOLN
4.0000 mg | Freq: Once | INTRAMUSCULAR | Status: AC
  Administered 2020-04-29: 4 mg via INTRAVENOUS
  Filled 2020-04-29: qty 2

## 2020-04-29 MED ORDER — FENTANYL CITRATE (PF) 100 MCG/2ML IJ SOLN
50.0000 ug | Freq: Once | INTRAMUSCULAR | Status: AC
  Administered 2020-04-29: 50 ug via INTRAVENOUS
  Filled 2020-04-29: qty 2

## 2020-04-29 NOTE — ED Triage Notes (Addendum)
Pt reports left kidney biopsy yesterday. Pt reports is currently undergoing treatment for bladder cancer. Pt reports has urostomy. Pt reports last chemo treatment in June. Pt reports is on immunotherapy since last Friday. Pt reports abdominal distention,nausea, epigastric pain x2 days.

## 2020-04-29 NOTE — ED Provider Notes (Signed)
[facility] EMERGENCY DEPARTMENT Provider Note   CSN: [id number] Arrival date & time: 04/29/20  1631     History Chief Complaint  Patient presents with  . Abdominal Pain    Darren [place] L[place]r. is a 54 y.o. male.  HPI Patient has stage IV bladder cancer.  Being seen at [facility] for.  Does have some known mass on his kidneys to that to be potentially a second malignancy.  Had biopsy of it done yesterday.  No fevers.  Now is having more abdominal pain.  Some mild nausea.  No vomiting.  States he feels as if he is extra gassy.  No diarrhea.  He has a right-sided ileal conduit that drains both kidneys.    Past Medical History:  Diagnosis Date  . Bladder tumor   . Cancer [facility])    bladder cancer  . GERD (gastroesophageal reflux disease)    occasional after taking flomax  . Hypogonadism male   . Nocturia   . Seasonal allergies   . Wears glasses     Patient Active Problem List   Diagnosis Date Noted  . Bladder cancer ([place]) 01/01/2019  . Hand eczema 12/17/2013  . Allergic rhinitis, cause unspecified 06/19/2011  . Routine general medical examination at a health care facility 06/12/2011  . HYPERTROPHY PROSTATE W/UR OBST & OTH LUTS 08/10/2009  . HYPOGONADISM 05/28/2009  . GERD 05/28/2009    Past Surgical History:  Procedure Laterality Date  . CARDIAC CATHETERIZATION  11-21-2002  dr w. Albertine Patricia  @[facility]    normal coronary arteries,  ef 60%  . CYSTOSCOPY WITH INJECTION N/A 01/01/2019   Procedure: CYSTOSCOPY WITH INJECTION;  Surgeon: Alexis Frock, MD;  Location: [facility] ORS;  Service: Urology;  Laterality: N/A;  . NO PAST SURGERIES    . TRANSURETHRAL RESECTION OF BLADDER TUMOR N/A 12/02/2018   Procedure: TRANSURETHRAL RESECTION OF BLADDER TUMOR (TURBT);  Surgeon: Cleon Gustin, MD;  Location: [facility] ORS;  Service: Urology;  Laterality: N/A;  30 MINS       Family History  Problem Relation Age of Onset  . Hyperlipidemia Other   . Hypertension Other   . Kidney disease Other   .  Thyroid disease Other   . Coronary artery disease Other   . Cancer Neg Hx     Social History   Tobacco Use  . Smoking status: Never Smoker  . Smokeless tobacco: Current User    Types: Snuff  . Tobacco comment: per pt dip tobacco since teen   Vaping Use  . Vaping Use: Never used  Substance Use Topics  . Alcohol use: Yes    Alcohol/week: 7.0 standard drinks    Types: 7 Cans of beer per week    Comment: one beer occ  . Drug use: Never    Home Medications Prior to Admission medications   Medication Sig Start Date End Date Taking? Authorizing Provider  tamsulosin (FLOMAX) 0.4 MG CAPS capsule TAKE 1 CAPSULE AT BEDTIME 10/07/19   McKenzie, Candee Furbish, MD  Testosterone 20.25 MG/ACT (1.62%) GEL Apply 1 Pump topically daily. Apply 1 pump to shoulder areas 11/22/18   [provider]    Allergies    Patient has no known allergies.  Review of Systems   Review of Systems  Constitutional: Positive for appetite change.  HENT: Negative for congestion.   Respiratory: Negative for shortness of breath.   Cardiovascular: Negative for chest pain.  Gastrointestinal: Positive for abdominal pain, constipation and nausea. Negative for vomiting.  Genitourinary: Negative for flank  pain.  Musculoskeletal: Negative for back pain.  Skin: Negative for rash.  Neurological: Negative for weakness.  Psychiatric/Behavioral: Negative for confusion.    Physical Exam Updated Vital Signs BP 121/82   Pulse 77   Temp 97.9 F (36.6 C) (Oral)   Resp (!) 23   Ht 5\' 7"  (1.702 m)   Wt 86.2 kg   SpO2 100%   BMI 29.76 kg/m   Physical Exam Nursing note reviewed.  HENT:     Head: Atraumatic.  Cardiovascular:     Rate and Rhythm: Normal rate.  Pulmonary:     Breath sounds: Normal breath sounds.  Abdominal:     Comments: Ileal conduit right side of abdomen.  No blood in the bag.  Mild diffuse abdominal tenderness.  Some distention.  No hernia palpated.  Genitourinary:    Comments: No CVA  tenderness.  Dressing in place at site of biopsy without drainage or blood. Skin:    General: Skin is warm.     Capillary Refill: Capillary refill takes less than 2 seconds.  Neurological:     Mental Status: He is alert.     ED Results / Procedures / Treatments   Labs (all labs ordered are listed, but only abnormal results are displayed) Labs Reviewed  COMPREHENSIVE METABOLIC PANEL - Abnormal; Notable for the following components:      Result Value   BUN 21 (*)    Creatinine, Ser 1.31 (*)    All other components within normal limits  CBC - Abnormal; Notable for the following components:   Hemoglobin 11.5 (*)    HCT 36.6 (*)    All other components within normal limits  URINALYSIS, ROUTINE W REFLEX MICROSCOPIC - Abnormal; Notable for the following components:   APPearance HAZY (*)    Hgb urine dipstick SMALL (*)    Protein, ur 100 (*)    Nitrite POSITIVE (*)    Leukocytes,Ua LARGE (*)    All other components within normal limits  SARS CORONAVIRUS 2 BY RT PCR (HOSPITAL ORDER, [facility])  LIPASE, BLOOD    EKG None  Radiology CT ABDOMEN PELVIS W CONTRAST  Result Date: 04/29/2020 CLINICAL DATA:  Abdominal distension with nausea. Acute nonlocalized abdominal pain. Patient had renal biopsy yesterday at [facility]. History of bladder and prostate cancer. EXAM: CT ABDOMEN AND PELVIS WITH CONTRAST TECHNIQUE: Multidetector CT imaging of the abdomen and pelvis was performed using the standard protocol following bolus administration of intravenous contrast. CONTRAST:  142mL OMNIPAQUE IOHEXOL 300 MG/ML  SOLN COMPARISON:  Most recent abdominopelvic CT available 09/01/2019. Report from abdominal CT 04/16/2020 performed at an outside institution reviewed. FINDINGS: Lower chest: Linear atelectasis in the left greater than right lower lobe. No pleural fluid. No basilar pneumothorax. Hepatobiliary: No focal liver abnormality is seen. No gallstones, gallbladder wall thickening,  or biliary dilatation. Pancreas: No ductal dilatation or inflammation. Spleen: Homogeneous density without focal abnormality. No perisplenic bleed. Adrenals/Urinary Tract: Normal right adrenal gland. Ill-defined density adjacent to the left adrenal gland measures approximately 11 mm, series 2, image 23. Atrophic left renal parenchyma with ill-defined corticomedullary definity shin and renal parenchymal atrophy. Lesion in the lower pole measures approximately 19 mm and has internal focus of air, may represent site of biopsy. There is no evidence of adjacent hemorrhage. There is an exophytic lesion from the upper left kidney measuring 2.8 cm. There are nonobstructing stones in the lower kidney. There is left hydronephrosis with layering stones in the upper left ureter, series  2, image 40, the stones are nonobstructing. More distal ureter is dilated to the ileal conduit. There is no definite ureteral thickening or inflammatory change. No evidence of post biopsy hemorrhage. Mild prominence of the right renal collecting system tapering to the ileal conduit without frank hydronephrosis. Small low-density lesions in the right kidney likely represent cysts. Cystectomy with right lower quadrant ileal conduit. Stomach/Bowel: Stomach distended with intraluminal fluid. There is a tiny hiatal hernia. Dilated fluid-filled bowel loops with fecalization of small bowel contents in the lower abdomen. Transition point in the right lower quadrant, series 2, image 60 and series 5, image 49. Transition is adjacent to the ileal conduit. There is mesenteric edema and small amount of free fluid in the region of most prominent small bowel dilatation. No evidence of bowel pneumatosis. Enteric sutures are noted in the more distal small bowel. More distal small bowel is decompressed. Appendix not visualized. Small volume of colonic stool. Vascular/Lymphatic: Aortic atherosclerosis. No aortic aneurysm. Patent portal vein. Left periaortic node  measures 15 mm, series 2, image 35. Tiny left perinephric nodule, series 2, image 33. There is no pelvic adenopathy. Reproductive: Prostatectomy. Other: Small amount of left retroperitoneal air likely related to renal biopsy. Small amount of ascites and free fluid scattered throughout the mesentery and in the pelvis. There is no evidence of omental thickening. There is no free air. Small fat containing umbilical hernia. Musculoskeletal: No acute osseous abnormality or focal bone lesion. No evidence of intramuscular bleed post recent renal biopsy. IMPRESSION: 1. Small-bowel obstruction with transition point in the right lower quadrant, likely secondary to adhesions. Site of obstruction is adjacent to ileal conduit. 2. History of left renal neoplasm and biopsy yesterday at an outside institution. Small amount of air within the left lower pole lesion and in the left retroperitoneum is likely related to recent biopsy. There is no evidence of post biopsy hemorrhage. Staging of malignancy is difficult due to lack of direct comparison, and reference outside CT for complete malignancy staging. 3. Left hydronephrosis with layering stones in the upper left ureter, the stones are nonobstructing. More distal ureter is dilated to the ileal conduit without definite ureteral thickening or inflammatory change. 4. Left periaortic adenopathy. 5. Cysto prostatectomy with ileal conduit. 6. Small volume of free fluid and ascites. Aortic Atherosclerosis (ICD10-I70.0). Electronically Signed   By: Keith Rake M.D.   On: 04/29/2020 18:43    Procedures Procedures (including critical care time)  Medications Ordered in ED Medications  ondansetron (ZOFRAN) injection 4 mg (4 mg Intravenous Given 04/29/20 1718)  fentaNYL (SUBLIMAZE) injection 50 mcg (50 mcg Intravenous Given 04/29/20 1727)  iohexol (OMNIPAQUE) 300 MG/ML solution 100 mL (100 mLs Intravenous Contrast Given 04/29/20 1820)    ED Course  I have reviewed the triage vital  signs and the nursing notes.  Pertinent labs & imaging results that were available during my care of the patient were reviewed by me and considered in my medical decision making (see chart for details).    MDM Rules/Calculators/A&P                          Patient with abdominal pain distention and nausea.  Had kidney biopsy yesterday and is on chemotherapy for stage IV bladder cancer.  Decreased GI output also.  CT scan done and showed bowel obstruction.  Discussed with [facility] about admission.  They were discussing with the surgeons about potential transfer, however patient now states that he does not want  to be admitted to the hospital.  States he is feeling a lot better.  His abdominal exam is much improved.  Tenderness and distention has resolved.  I think patient would benefit from admission to the hospital but I think it is reasonable for trial as an outpatient particular sincerely does not willing to stay.  Will return to the ER if symptoms worsen.  Will follow up with his doctors.  Discharge home somewhat against my advice but more of a shared decision making. Final Clinical Impression(s) / ED Diagnoses Final diagnoses:  Intestinal adhesions with partial obstruction [facility])    Rx / DC Orders ED Discharge Orders    None       Davonna Belling, MD 04/29/20 1944

## 2020-04-29 NOTE — ED Notes (Signed)
Pt transported to CT ?

## 2020-04-29 NOTE — Discharge Instructions (Addendum)
Try liquids and slowly advance her diet.  Return for worsening of symptoms fevers or increasing abdominal pain.  Follow-up with your doctors.

## 2020-05-16 ENCOUNTER — Encounter (HOSPITAL_COMMUNITY): Payer: Self-pay | Admitting: Emergency Medicine

## 2020-05-16 ENCOUNTER — Other Ambulatory Visit: Payer: Self-pay

## 2020-05-16 ENCOUNTER — Inpatient Hospital Stay (HOSPITAL_COMMUNITY)
Admission: EM | Admit: 2020-05-16 | Discharge: 2020-05-18 | DRG: 389 | Discharge status: Against Medical Advice | Payer: BC Managed Care – PPO | Attending: Internal Medicine | Admitting: Internal Medicine

## 2020-05-16 ENCOUNTER — Emergency Department (HOSPITAL_COMMUNITY): Payer: BC Managed Care – PPO

## 2020-05-16 DIAGNOSIS — N183 Chronic kidney disease, stage 3 unspecified: Secondary | ICD-10-CM

## 2020-05-16 DIAGNOSIS — R509 Fever, unspecified: Secondary | ICD-10-CM | POA: Diagnosis not present

## 2020-05-16 DIAGNOSIS — G620 Drug-induced polyneuropathy: Secondary | ICD-10-CM | POA: Diagnosis present

## 2020-05-16 DIAGNOSIS — C7972 Secondary malignant neoplasm of left adrenal gland: Secondary | ICD-10-CM | POA: Diagnosis present

## 2020-05-16 DIAGNOSIS — T451X5A Adverse effect of antineoplastic and immunosuppressive drugs, initial encounter: Secondary | ICD-10-CM | POA: Diagnosis present

## 2020-05-16 DIAGNOSIS — Z8551 Personal history of malignant neoplasm of bladder: Secondary | ICD-10-CM

## 2020-05-16 DIAGNOSIS — Z86711 Personal history of pulmonary embolism: Secondary | ICD-10-CM

## 2020-05-16 DIAGNOSIS — Z5329 Procedure and treatment not carried out because of patient's decision for other reasons: Secondary | ICD-10-CM | POA: Diagnosis present

## 2020-05-16 DIAGNOSIS — N1831 Chronic kidney disease, stage 3a: Secondary | ICD-10-CM | POA: Diagnosis present

## 2020-05-16 DIAGNOSIS — K565 Intestinal adhesions [bands], unspecified as to partial versus complete obstruction: Principal | ICD-10-CM | POA: Diagnosis present

## 2020-05-16 DIAGNOSIS — C7902 Secondary malignant neoplasm of left kidney and renal pelvis: Secondary | ICD-10-CM | POA: Diagnosis present

## 2020-05-16 DIAGNOSIS — R112 Nausea with vomiting, unspecified: Secondary | ICD-10-CM

## 2020-05-16 DIAGNOSIS — E1122 Type 2 diabetes mellitus with diabetic chronic kidney disease: Secondary | ICD-10-CM | POA: Diagnosis present

## 2020-05-16 DIAGNOSIS — C679 Malignant neoplasm of bladder, unspecified: Secondary | ICD-10-CM | POA: Diagnosis present

## 2020-05-16 DIAGNOSIS — Z906 Acquired absence of other parts of urinary tract: Secondary | ICD-10-CM

## 2020-05-16 DIAGNOSIS — Z79899 Other long term (current) drug therapy: Secondary | ICD-10-CM

## 2020-05-16 DIAGNOSIS — Z9221 Personal history of antineoplastic chemotherapy: Secondary | ICD-10-CM

## 2020-05-16 DIAGNOSIS — Z720 Tobacco use: Secondary | ICD-10-CM

## 2020-05-16 DIAGNOSIS — R109 Unspecified abdominal pain: Secondary | ICD-10-CM

## 2020-05-16 DIAGNOSIS — K219 Gastro-esophageal reflux disease without esophagitis: Secondary | ICD-10-CM | POA: Diagnosis present

## 2020-05-16 DIAGNOSIS — D649 Anemia, unspecified: Secondary | ICD-10-CM | POA: Diagnosis present

## 2020-05-16 DIAGNOSIS — K56609 Unspecified intestinal obstruction, unspecified as to partial versus complete obstruction: Secondary | ICD-10-CM | POA: Diagnosis present

## 2020-05-16 DIAGNOSIS — Z20822 Contact with and (suspected) exposure to covid-19: Secondary | ICD-10-CM | POA: Diagnosis present

## 2020-05-16 LAB — COMPREHENSIVE METABOLIC PANEL
ALT: 18 U/L (ref 0–44)
AST: 16 U/L (ref 15–41)
Albumin: 4 g/dL (ref 3.5–5.0)
Alkaline Phosphatase: 86 U/L (ref 38–126)
Anion gap: 11 (ref 5–15)
BUN: 25 mg/dL — ABNORMAL HIGH (ref 6–20)
CO2: 22 mmol/L (ref 22–32)
Calcium: 10.1 mg/dL (ref 8.9–10.3)
Chloride: 102 mmol/L (ref 98–111)
Creatinine, Ser: 1.48 mg/dL — ABNORMAL HIGH (ref 0.61–1.24)
GFR calc Af Amer: >60 mL/min (ref >60)
GFR calc non Af Amer: 53 mL/min — ABNORMAL LOW (ref >60)
Glucose, Bld: 125 mg/dL — ABNORMAL HIGH (ref 70–99)
Potassium: 4.2 mmol/L (ref 3.5–5.1)
Sodium: 135 mmol/L (ref 135–145)
Total Bilirubin: 0.7 mg/dL (ref 0.3–1.2)
Total Protein: 7.1 g/dL (ref 6.5–8.1)

## 2020-05-16 LAB — URINALYSIS, ROUTINE W REFLEX MICROSCOPIC
Bilirubin Urine: NEGATIVE
Glucose, UA: NEGATIVE mg/dL
Ketones, ur: NEGATIVE mg/dL
Nitrite: NEGATIVE
Protein, ur: 100 mg/dL — AB
Specific Gravity, Urine: 1.014 (ref 1.005–1.030)
WBC, UA: >50 WBC/hpf — ABNORMAL HIGH (ref 0–5)
pH: 7 (ref 5.0–8.0)

## 2020-05-16 LAB — RESPIRATORY PANEL BY RT PCR (FLU A&B, COVID)
Influenza A by PCR: NEGATIVE
Influenza B by PCR: NEGATIVE
SARS Coronavirus 2 by RT PCR: NEGATIVE

## 2020-05-16 LAB — CBC WITH DIFFERENTIAL/PLATELET
Abs Immature Granulocytes: 0.02 10*3/uL (ref 0.00–0.07)
Basophils Absolute: 0 10*3/uL (ref 0.0–0.1)
Basophils Relative: 0 %
Eosinophils Absolute: 0.1 10*3/uL (ref 0.0–0.5)
Eosinophils Relative: 1 %
HCT: 37.5 % — ABNORMAL LOW (ref 39.0–52.0)
Hemoglobin: 11.5 g/dL — ABNORMAL LOW (ref 13.0–17.0)
Immature Granulocytes: 0 %
Lymphocytes Relative: 8 %
Lymphs Abs: 0.8 10*3/uL (ref 0.7–4.0)
MCH: 25.8 pg — ABNORMAL LOW (ref 26.0–34.0)
MCHC: 30.7 g/dL (ref 30.0–36.0)
MCV: 84.3 fL (ref 80.0–100.0)
Monocytes Absolute: 0.6 10*3/uL (ref 0.1–1.0)
Monocytes Relative: 6 %
Neutro Abs: 8.4 10*3/uL — ABNORMAL HIGH (ref 1.7–7.7)
Neutrophils Relative %: 85 %
Platelets: 323 10*3/uL (ref 150–400)
RBC: 4.45 MIL/uL (ref 4.22–5.81)
RDW: 14.2 % (ref 11.5–15.5)
WBC: 9.9 10*3/uL (ref 4.0–10.5)
nRBC: 0 % (ref 0.0–0.2)

## 2020-05-16 LAB — LIPASE, BLOOD: Lipase: 27 U/L (ref 11–51)

## 2020-05-16 MED ORDER — METOCLOPRAMIDE HCL 5 MG/ML IJ SOLN
10.0000 mg | Freq: Once | INTRAMUSCULAR | Status: AC
  Administered 2020-05-16: 10 mg via INTRAVENOUS
  Filled 2020-05-16: qty 2

## 2020-05-16 MED ORDER — IOHEXOL 300 MG/ML  SOLN
100.0000 mL | Freq: Once | INTRAMUSCULAR | Status: AC | PRN
  Administered 2020-05-16: 100 mL via INTRAVENOUS

## 2020-05-16 MED ORDER — FENTANYL CITRATE (PF) 100 MCG/2ML IJ SOLN
100.0000 ug | Freq: Once | INTRAMUSCULAR | Status: AC
  Administered 2020-05-16: 100 ug via INTRAVENOUS
  Filled 2020-05-16: qty 2

## 2020-05-16 NOTE — ED Triage Notes (Signed)
Pt is a stage 4 CA pt, pt began to have fever, chills and generalized body aches

## 2020-05-16 NOTE — ED Provider Notes (Signed)
[facility] EMERGENCY DEPARTMENT Provider Note   CSN: [id number] Arrival date & time: 05/16/20  1909     History Chief Complaint  Patient presents with  . Fever    Darren Brady. is a 54 y.o. male with a past medical history of metastatic bladder cancer status post cystectomy with urostomy placement.  Patient is currently under treatment with [facility] cancer.  He has undergone chemotherapy and was recently taken off of immunotherapy because it was not working is scheduled to start chemotherapy again on 1 October.  He did was seen in the emergency department 2 weeks ago with abdominal pain, had a CT scan that showed a bowel obstruction however the patient declined to be admitted at that time.  He states he went home, stayed on liquid diet and his symptoms seem to improve within about 24 hours.  Patient states that his symptoms returned last night and have progressively worsened.  He complains of severe abdominal pain, nausea, vomiting.  He is not passing gas or making bowel movements.  He has noticed distention in the abdomen and thinks his bowel may be obstructed again.  He denies fever but states that he is getting cold sweats whenever he throws up.  He denies any other symptoms at this time  HPI     Past Medical History:  Diagnosis Date  . Bladder tumor   . Cancer [facility])    bladder cancer  . GERD (gastroesophageal reflux disease)    occasional after taking flomax  . Hypogonadism male   . Nocturia   . Seasonal allergies   . Wears glasses     Patient Active Problem List   Diagnosis Date Noted  . Bladder cancer ([place]) 01/01/2019  . Hand eczema 12/17/2013  . Allergic rhinitis, cause unspecified 06/19/2011  . Routine general medical examination at a health care facility 06/12/2011  . HYPERTROPHY PROSTATE W/UR OBST & OTH LUTS 08/10/2009  . HYPOGONADISM 05/28/2009  . GERD 05/28/2009    Past Surgical History:  Procedure Laterality Date  . CARDIAC CATHETERIZATION  11-21-2002   dr w. Albertine Patricia  @[facility]    normal coronary arteries,  ef 60%  . CYSTOSCOPY WITH INJECTION N/A 01/01/2019   Procedure: CYSTOSCOPY WITH INJECTION;  Surgeon: Alexis Frock, MD;  Location: [facility] ORS;  Service: Urology;  Laterality: N/A;  . NO PAST SURGERIES    . TRANSURETHRAL RESECTION OF BLADDER TUMOR N/A 12/02/2018   Procedure: TRANSURETHRAL RESECTION OF BLADDER TUMOR (TURBT);  Surgeon: Cleon Gustin, MD;  Location: [facility] ORS;  Service: Urology;  Laterality: N/A;  30 MINS       Family History  Problem Relation Age of Onset  . Hyperlipidemia Other   . Hypertension Other   . Kidney disease Other   . Thyroid disease Other   . Coronary artery disease Other   . Cancer Neg Hx     Social History   Tobacco Use  . Smoking status: Never Smoker  . Smokeless tobacco: Current User    Types: Snuff  . Tobacco comment: per pt dip tobacco since teen   Vaping Use  . Vaping Use: Never used  Substance Use Topics  . Alcohol use: Yes    Alcohol/week: 7.0 standard drinks    Types: 7 Cans of beer per week    Comment: one beer occ  . Drug use: Never    Home Medications Prior to Admission medications   Medication Sig Start Date End Date Taking? Authorizing Provider  tamsulosin (FLOMAX) 0.4 MG CAPS  capsule TAKE 1 CAPSULE AT BEDTIME 10/07/19   McKenzie, Candee Furbish, MD  Testosterone 20.25 MG/ACT (1.62%) GEL Apply 1 Pump topically daily. Apply 1 pump to shoulder areas 11/22/18   [provider]    Allergies    Patient has no known allergies.  Review of Systems   Review of Systems Ten systems reviewed and are negative for acute change, except as noted in the HPI.   Physical Exam Updated Vital Signs BP 105/73 (BP Location: Right Arm)   Pulse 99   Temp 99.7 F (37.6 C) (Oral)   Resp (!) 21   Ht 5\' 7"  (1.702 m)   Wt 86 kg   SpO2 100%   BMI 29.69 kg/m   Physical Exam Vitals and nursing note reviewed.  Constitutional:      General: He is not in acute distress.    Appearance: He is  well-developed. He is not diaphoretic.  HENT:     Head: Normocephalic and atraumatic.  Eyes:     General: No scleral icterus.    Conjunctiva/sclera: Conjunctivae normal.  Cardiovascular:     Rate and Rhythm: Normal rate and regular rhythm.     Heart sounds: Normal heart sounds.  Pulmonary:     Effort: Pulmonary effort is normal. No respiratory distress.     Breath sounds: Normal breath sounds.  Abdominal:     General: There is distension.     Tenderness: There is abdominal tenderness.  Musculoskeletal:     Cervical back: Normal range of motion and neck supple.  Skin:    General: Skin is warm and dry.  Neurological:     Mental Status: He is alert.  Psychiatric:        Behavior: Behavior normal.     ED Results / Procedures / Treatments   Labs (all labs ordered are listed, but only abnormal results are displayed) Labs Reviewed  RESPIRATORY PANEL BY RT PCR (FLU A&B, COVID)    EKG None  Radiology No results found.  Procedures Procedures (including critical care time)  Medications Ordered in ED Medications - No data to display  ED Course  I have reviewed the triage vital signs and the nursing notes.  Pertinent labs & imaging results that were available during my care of the patient were reviewed by me and considered in my medical decision making (see chart for details).    MDM Rules/Calculators/A&P                          CC:abd pain and vomiting VS: BP 115/76 (BP Location: Right Arm)   Pulse 88   Temp 98.5 F (36.9 C) (Oral)   Resp 20   Ht 5\' 7"  (1.702 m)   Wt 85.5 kg   SpO2 100%   BMI 29.52 kg/m  HY:IFOYDXA is gathered by patient  and EMR. Previous records obtained and reviewed. DDX:The patient's complaint of abdominal pain involves an extensive number of diagnostic and treatment options, and is a complaint that carries with it a high risk of complications, morbidity, and potential mortality. Given the large differential diagnosis, medical decision  making is of high complexity. .The differential diagnosis for generalized abdominal pain includes, but is not limited to AAA, gastroenteritis, appendicitis, Bowel obstruction, Bowel perforation. Gastroparesis, DKA, Hernia, Inflammatory bowel disease, mesenteric ischemia, pancreatitis, peritonitis SBP, volvulus.  Labs: I ordered reviewed and interpreted labs which included CBC: which shows mild normocytic anemia- no leukocytosis UA- appears contaminated- collected from Urostomy bag  cmp- baseline renal insufficiency without elevated LFTs Imaging: CT ordered- results pending  BWI:OMBTDHR here with n/v/ abd pain, known metastatic cancer, recent SBO with high suspicion for recurrence.  Imaging pending= sign out given to Dr. Danton Clap Jenetta Downer. was evaluated in Emergency Department on 05/19/2020 for the symptoms described in the history of present illness. He was evaluated in the context of the global COVID-19 pandemic, which necessitated consideration that the patient might be at risk for infection with the SARS-CoV-2 virus that causes COVID-19. Institutional protocols and algorithms that pertain to the evaluation of patients at risk for COVID-19 are in a state of rapid change based on information released by regulatory bodies including the CDC and federal and state organizations. These policies and algorithms were followed during the patient's care in the ED.    Final Clinical Impression(s) / ED Diagnoses Final diagnoses:  None    Rx / DC Orders ED Discharge Orders    None       Margarita Mail, PA-C 05/19/20 1251    Milton Ferguson, MD 05/22/20 [phone]

## 2020-05-17 ENCOUNTER — Encounter (HOSPITAL_COMMUNITY): Payer: Self-pay | Admitting: Internal Medicine

## 2020-05-17 ENCOUNTER — Inpatient Hospital Stay (HOSPITAL_COMMUNITY): Payer: BC Managed Care – PPO

## 2020-05-17 DIAGNOSIS — K219 Gastro-esophageal reflux disease without esophagitis: Secondary | ICD-10-CM | POA: Diagnosis present

## 2020-05-17 DIAGNOSIS — K56609 Unspecified intestinal obstruction, unspecified as to partial versus complete obstruction: Secondary | ICD-10-CM

## 2020-05-17 DIAGNOSIS — C679 Malignant neoplasm of bladder, unspecified: Secondary | ICD-10-CM | POA: Diagnosis not present

## 2020-05-17 DIAGNOSIS — E1122 Type 2 diabetes mellitus with diabetic chronic kidney disease: Secondary | ICD-10-CM | POA: Diagnosis present

## 2020-05-17 DIAGNOSIS — R109 Unspecified abdominal pain: Secondary | ICD-10-CM

## 2020-05-17 DIAGNOSIS — Z906 Acquired absence of other parts of urinary tract: Secondary | ICD-10-CM | POA: Diagnosis not present

## 2020-05-17 DIAGNOSIS — D649 Anemia, unspecified: Secondary | ICD-10-CM | POA: Diagnosis present

## 2020-05-17 DIAGNOSIS — N183 Chronic kidney disease, stage 3 unspecified: Secondary | ICD-10-CM

## 2020-05-17 DIAGNOSIS — R509 Fever, unspecified: Secondary | ICD-10-CM | POA: Diagnosis present

## 2020-05-17 DIAGNOSIS — Z9221 Personal history of antineoplastic chemotherapy: Secondary | ICD-10-CM | POA: Diagnosis not present

## 2020-05-17 DIAGNOSIS — Z5329 Procedure and treatment not carried out because of patient's decision for other reasons: Secondary | ICD-10-CM | POA: Diagnosis present

## 2020-05-17 DIAGNOSIS — Z720 Tobacco use: Secondary | ICD-10-CM | POA: Diagnosis not present

## 2020-05-17 DIAGNOSIS — R112 Nausea with vomiting, unspecified: Secondary | ICD-10-CM

## 2020-05-17 DIAGNOSIS — Z86711 Personal history of pulmonary embolism: Secondary | ICD-10-CM | POA: Diagnosis not present

## 2020-05-17 DIAGNOSIS — C7972 Secondary malignant neoplasm of left adrenal gland: Secondary | ICD-10-CM | POA: Diagnosis present

## 2020-05-17 DIAGNOSIS — C7902 Secondary malignant neoplasm of left kidney and renal pelvis: Secondary | ICD-10-CM | POA: Diagnosis present

## 2020-05-17 DIAGNOSIS — G620 Drug-induced polyneuropathy: Secondary | ICD-10-CM | POA: Diagnosis present

## 2020-05-17 DIAGNOSIS — T451X5A Adverse effect of antineoplastic and immunosuppressive drugs, initial encounter: Secondary | ICD-10-CM | POA: Diagnosis present

## 2020-05-17 DIAGNOSIS — N1831 Chronic kidney disease, stage 3a: Secondary | ICD-10-CM | POA: Diagnosis present

## 2020-05-17 DIAGNOSIS — Z20822 Contact with and (suspected) exposure to covid-19: Secondary | ICD-10-CM | POA: Diagnosis present

## 2020-05-17 DIAGNOSIS — Z79899 Other long term (current) drug therapy: Secondary | ICD-10-CM | POA: Diagnosis not present

## 2020-05-17 DIAGNOSIS — K565 Intestinal adhesions [bands], unspecified as to partial versus complete obstruction: Secondary | ICD-10-CM | POA: Diagnosis present

## 2020-05-17 DIAGNOSIS — Z8551 Personal history of malignant neoplasm of bladder: Secondary | ICD-10-CM | POA: Diagnosis not present

## 2020-05-17 LAB — HIV ANTIBODY (ROUTINE TESTING W REFLEX): HIV Screen 4th Generation wRfx: NONREACTIVE

## 2020-05-17 LAB — CBC
HCT: 35.1 % — ABNORMAL LOW (ref 39.0–52.0)
Hemoglobin: 10.6 g/dL — ABNORMAL LOW (ref 13.0–17.0)
MCH: 25.5 pg — ABNORMAL LOW (ref 26.0–34.0)
MCHC: 30.2 g/dL (ref 30.0–36.0)
MCV: 84.4 fL (ref 80.0–100.0)
Platelets: 308 10*3/uL (ref 150–400)
RBC: 4.16 MIL/uL — ABNORMAL LOW (ref 4.22–5.81)
RDW: 14.5 % (ref 11.5–15.5)
WBC: 6.4 10*3/uL (ref 4.0–10.5)
nRBC: 0 % (ref 0.0–0.2)

## 2020-05-17 LAB — COMPREHENSIVE METABOLIC PANEL
ALT: 15 U/L (ref 0–44)
AST: 13 U/L — ABNORMAL LOW (ref 15–41)
Albumin: 3.6 g/dL (ref 3.5–5.0)
Alkaline Phosphatase: 80 U/L (ref 38–126)
Anion gap: 12 (ref 5–15)
BUN: 26 mg/dL — ABNORMAL HIGH (ref 6–20)
CO2: 25 mmol/L (ref 22–32)
Calcium: 9.3 mg/dL (ref 8.9–10.3)
Chloride: 101 mmol/L (ref 98–111)
Creatinine, Ser: 1.49 mg/dL — ABNORMAL HIGH (ref 0.61–1.24)
GFR calc Af Amer: >60 mL/min (ref >60)
GFR calc non Af Amer: 52 mL/min — ABNORMAL LOW (ref >60)
Glucose, Bld: 117 mg/dL — ABNORMAL HIGH (ref 70–99)
Potassium: 4.6 mmol/L (ref 3.5–5.1)
Sodium: 138 mmol/L (ref 135–145)
Total Bilirubin: 0.6 mg/dL (ref 0.3–1.2)
Total Protein: 6.4 g/dL — ABNORMAL LOW (ref 6.5–8.1)

## 2020-05-17 LAB — PROTIME-INR
INR: 1.3 — ABNORMAL HIGH (ref 0.8–1.2)
Prothrombin Time: 15.3 seconds — ABNORMAL HIGH (ref 11.4–15.2)

## 2020-05-17 LAB — MAGNESIUM: Magnesium: 2.1 mg/dL (ref 1.7–2.4)

## 2020-05-17 LAB — APTT
aPTT: 35 seconds (ref 24–36)
aPTT: 71 seconds — ABNORMAL HIGH (ref 24–36)

## 2020-05-17 LAB — PHOSPHORUS: Phosphorus: 4 mg/dL (ref 2.5–4.6)

## 2020-05-17 LAB — HEPARIN LEVEL (UNFRACTIONATED): Heparin Unfractionated: 2.06 IU/mL — ABNORMAL HIGH (ref 0.30–0.70)

## 2020-05-17 MED ORDER — PHENOL 1.4 % MT LIQD
1.0000 | OROMUCOSAL | Status: DC | PRN
  Administered 2020-05-17: 1 via OROMUCOSAL
  Filled 2020-05-17: qty 177

## 2020-05-17 MED ORDER — HYDROMORPHONE HCL 1 MG/ML IJ SOLN
1.0000 mg | Freq: Once | INTRAMUSCULAR | Status: AC
  Administered 2020-05-17: 1 mg via INTRAVENOUS
  Filled 2020-05-17: qty 1

## 2020-05-17 MED ORDER — HEPARIN (PORCINE) 25000 UT/250ML-% IV SOLN
1200.0000 [IU]/h | INTRAVENOUS | Status: DC
  Administered 2020-05-17 – 2020-05-18 (×2): 1200 [IU]/h via INTRAVENOUS
  Filled 2020-05-17 (×2): qty 250

## 2020-05-17 MED ORDER — ZOLPIDEM TARTRATE 5 MG PO TABS: 5.0000 mg | ORAL_TABLET | Freq: Every evening | ORAL | Status: DC | PRN

## 2020-05-17 MED ORDER — CHLORHEXIDINE GLUCONATE CLOTH 2 % EX PADS: 6.0000 | MEDICATED_PAD | Freq: Every day | CUTANEOUS | Status: DC

## 2020-05-17 MED ORDER — SODIUM CHLORIDE 0.9 % IV SOLN: Freq: Once | INTRAVENOUS | Status: AC

## 2020-05-17 MED ORDER — ONDANSETRON HCL 4 MG/2ML IJ SOLN
4.0000 mg | Freq: Once | INTRAMUSCULAR | Status: AC
  Administered 2020-05-17: 4 mg via INTRAVENOUS
  Filled 2020-05-17: qty 2

## 2020-05-17 MED ORDER — HEPARIN BOLUS VIA INFUSION
4000.0000 [IU] | Freq: Once | INTRAVENOUS | Status: AC
  Administered 2020-05-17: 4000 [IU] via INTRAVENOUS
  Filled 2020-05-17: qty 4000

## 2020-05-17 MED ORDER — MORPHINE SULFATE (PF) 2 MG/ML IV SOLN
2.0000 mg | INTRAVENOUS | Status: DC | PRN
  Administered 2020-05-17 (×2): 2 mg via INTRAVENOUS
  Filled 2020-05-17 (×3): qty 1

## 2020-05-17 MED ORDER — ENOXAPARIN SODIUM 100 MG/ML ~~LOC~~ SOLN: 85.0000 mg | Freq: Two times a day (BID) | SUBCUTANEOUS | Status: DC

## 2020-05-17 MED ORDER — ONDANSETRON HCL 4 MG/2ML IJ SOLN: 4.0000 mg | Freq: Four times a day (QID) | INTRAMUSCULAR | Status: DC | PRN

## 2020-05-17 NOTE — Progress Notes (Signed)
ANTICOAGULATION CONSULT NOTE - Initial Consult  Pharmacy Consult for heparin IV Indication: hx of pulmonary embolus  No Known Allergies  Patient Measurements: Height: 5\' 7"  (170.2 cm) Weight: 85.5 kg (188 lb 7.9 oz) IBW/kg (Calculated) : 66.1  Heparin DW: 83.5 kg    Vital Signs: Temp: 98.3 F (36.8 C) (09/27 1017) Temp Source: Oral (09/27 1017) BP: 125/84 (09/27 1816) Pulse Rate: 82 (09/27 1816)  Labs: Recent Labs    05/16/20 2124 05/17/20 0534 05/17/20 2008  HGB 11.5* 10.6*  --   HCT 37.5* 35.1*  --   PLT 323 308  --   APTT  --  35 71*  LABPROT  --  15.3*  --   INR  --  1.3*  --   HEPARINUNFRC  --  2.06*  --   CREATININE 1.48* 1.49*  --     Estimated Creatinine Clearance: 58.6 mL/min (A) (by C-G formula based on SCr of 1.49 mg/dL (H)).   Assessment: 54 yo M with hx of asx PE (around May 2021) in the setting of stage VI bladder cancer. Held Apixaban PTA, LD 9/26 9am. Pharmacy consulted for heparin.  Will need to dose based on aPTT until correlation with heparin levels.  PM update: APTT 71, in range  Goal of Therapy:  Monitor platelets by anticoagulation protocol: Yes  HL 0.3-0.7 APTT 66-102 sec   Plan:   Continue heparin infusion at 1200 units/hr. Confirmatory Heparin level and APTT in 6 hours Monitor for signs/symptoms of bleeding    Jakarri Lesko A. Levada Dy, PharmD, BCPS, FNKF Clinical Pharmacist [employer] Please utilize Amion for appropriate phone number to reach the unit pharmacist ([place])   05/17/2020 9:20 PM

## 2020-05-17 NOTE — ED Provider Notes (Signed)
CT scan has returned and does confirm small bowel obstruction with a transition point.  NG tube will be placed.  Patient is feeling much more comfortable after pain medication.  He is amenable to hospitalization.  [facility] is not excepting transfers at this time, will admit here.   Orpah Greek, MD 05/17/20 [phone]

## 2020-05-17 NOTE — Progress Notes (Signed)
ANTICOAGULATION CONSULT NOTE - Initial Consult  Pharmacy Consult for enoxaparin Indication: hx of pulmonary embolus  No Known Allergies  Patient Measurements: Height: 5\' 7"  (170.2 cm) Weight: 86 kg (189 lb 9.5 oz) IBW/kg (Calculated) : 66.1    Vital Signs: Temp: 99.7 F (37.6 C) (09/26 2002) Temp Source: Oral (09/26 2002) BP: 129/86 (09/27 0043) Pulse Rate: 92 (09/27 0043)  Labs: Recent Labs    05/16/20 2124  HGB 11.5*  HCT 37.5*  PLT 323  CREATININE 1.48*    Estimated Creatinine Clearance: 59.1 mL/min (A) (by C-G formula based on SCr of 1.48 mg/dL (H)).   Assessment: 54 yo M with hx of asx PE (around May 2021) in the setting of stage VI bladder cancer. Held Apixaban PTA, LD 9/26 9am. Pharmacy consulted for enoxaparin. ClCr >30 ml/min.   Goal of Therapy:  Monitor platelets by anticoagulation protocol: Yes   Plan:  Enoxaparin 85mg  Q12hr  Monitor for signs/symptoms of bleeding    Benetta Spar, PharmD, BCPS, BCCP Clinical Pharmacist  Please check AMION for all [place] phone numbers After 10:00 PM, call [place] [phone]

## 2020-05-17 NOTE — Progress Notes (Addendum)
ANTICOAGULATION CONSULT NOTE - Initial Consult  Pharmacy Consult for heparin IV Indication: hx of pulmonary embolus  No Known Allergies  Patient Measurements: Height: 5\' 7"  (170.2 cm) Weight: 85.5 kg (188 lb 7.9 oz) IBW/kg (Calculated) : 66.1  Heparin DW: 83.5 kg    Vital Signs: Temp: 98.3 F (36.8 C) (09/27 1017) Temp Source: Oral (09/27 1017) BP: 106/80 (09/27 1017) Pulse Rate: 63 (09/27 1017)  Labs: Recent Labs    05/16/20 2124 05/17/20 0534  HGB 11.5* 10.6*  HCT 37.5* 35.1*  PLT 323 308  APTT  --  35  LABPROT  --  15.3*  INR  --  1.3*  CREATININE 1.48* 1.49*    Estimated Creatinine Clearance: 58.6 mL/min (A) (by C-G formula based on SCr of 1.49 mg/dL (H)).   Assessment: 54 yo M with hx of asx PE (around May 2021) in the setting of stage VI bladder cancer. Held Apixaban PTA, LD 9/26 9am. Pharmacy consulted for heparin.  Will need to dose based on aPTT until correlation with heparin levels.  Goal of Therapy:  Monitor platelets by anticoagulation protocol: Yes  HL 0.3-0.7 APTT 66-102 sec   Plan:  Heparin 4000 units IV bolus x 1 Start heparin infusion at 1200 units/hr. Heparin level in 6 hours and daily. Monitor for signs/symptoms of bleeding    Margot Ables, PharmD Clinical Pharmacist 05/17/2020 10:27 AM

## 2020-05-17 NOTE — H&P (Signed)
History and Physical  Darren Brady. VWP:[id number] DOB: Oct 11, 1965 DOA: 05/16/2020  Referring physician: Orpah Greek, MD PCP: Darren Dials, MD  Patient coming from: Home  Chief Complaint: Abdominal pain  HPI: Darren Brady. is a 54 y.o. 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  Patient was recently taken off of chemotherapy due to nonresponse and now being scheduled to restart chemotherapy on October 1st.  He presents to the emergency department due to 1 day of abdominal pain, nausea and vomiting.  Patient complained of severe abdominal pain and distention as well as several episodes of nonbloody vomiting.  Last bowel movement was yesterday in the afternoon.  Abdominal pain was sharp in nature, worse in umbilical area with radiation to right and left lumbar regions.  He was recently seen in the emergency department on 9/9 due to abdominal pain, CT scan done at that time showed bowel obstruction, however patient declined to be admitted at that time, he states that his symptoms improved within 24 hours of being on liquid diet.  He denies fever, chills, cough, chest pain, shortness of breath.  ED Course:  In the emergency department, he was hemodynamically stable.  Work-up in the ED showed normocytic anemia, BMP showed BUN to creatinine 25/1.48 (baseline creatinine at 1.26-1.35).  SARS coronavirus 2 was negative.  CT abdomen and pelvis with contrast showed small bowel obstruction with transition point in the right lower quadrant adjacent to the ileal conduit.  IV Reglan, Zofran and Dilaudid were given.  Hospitalist was asked to admit him for further evaluation and management.  Review of Systems: Constitutional: Negative for chills and fever.    HENT: Negative for ear pain and sore throat.   Eyes: Negative for pain and visual disturbance.  Respiratory: Negative for cough, chest tightness and shortness of breath.   Cardiovascular: Negative for chest pain and palpitations.  Gastrointestinal: Positive for abdominal pain, abdominal distention and vomiting.  Endocrine: Negative for polyphagia and polyuria.  Genitourinary: Negative for decreased urine volume, dysuria, enuresis Musculoskeletal: Negative for arthralgias and back pain.  Skin: Negative for color change and rash.  Allergic/Immunologic: Negative for immunocompromised state.  Neurological: Negative for tremors, syncope, speech difficulty, weakness, light-headedness and headaches.  Hematological: Does not bruise/bleed easily.  All other systems reviewed and are negative    Past Medical History:  Diagnosis Date  . Bladder tumor   . Cancer [facility])    bladder cancer  . GERD (gastroesophageal reflux disease)    occasional after taking flomax  . Hypogonadism male   . Nocturia   . Seasonal allergies   . Wears glasses    Past Surgical History:  Procedure Laterality Date  . CARDIAC CATHETERIZATION  11-21-2002  dr w. Albertine Patricia  @[facility]    normal coronary arteries,  ef 60%  . CYSTOSCOPY WITH INJECTION N/A 01/01/2019   Procedure: CYSTOSCOPY WITH INJECTION;  Surgeon: Darren Frock, MD;  Location: [facility];  Service: Urology;  Laterality: N/A;  . NO PAST SURGERIES    . TRANSURETHRAL RESECTION OF BLADDER TUMOR N/A 12/02/2018   Procedure: TRANSURETHRAL RESECTION OF BLADDER TUMOR (TURBT);  Surgeon: Darren Gustin, MD;  Location: [facility];  Service: Urology;  Laterality: N/A;  30 MINS    Social History:  reports that he has never smoked. His smokeless tobacco  use includes snuff. He reports current alcohol use of about 7.0 standard drinks of alcohol per week. He reports that he does not use drugs.   No Known Allergies  Family History  Problem Relation Age of Onset  . Hyperlipidemia  Other   . Hypertension Other   . Kidney disease Other   . Thyroid disease Other   . Coronary artery disease Other   . Cancer Neg Hx     Prior to Admission medications   Medication Sig Start Date End Date Taking? Authorizing Provider  dexamethasone (DECADRON) 4 MG tablet Take 8mg  (2 x 4mg  tablets) by mouth in the morning for 2 days after the first day of each cycle, then as directed. 05/07/20  Yes [provider]  docusate sodium (COLACE) 50 MG capsule Take 50 mg by mouth 2 (two) times daily as needed.   Yes [provider]  ELIQUIS 5 MG TABS tablet Take 5 mg by mouth every 12 (twelve) hours. 05/08/20  Yes [provider]  ferrous sulfate 325 (65 FE) MG tablet Take 325 mg by mouth daily.   Yes [provider]  gabapentin (NEURONTIN) 300 MG capsule Take 300 mg by mouth See admin instructions. Take 1 capsule by mouth in the morning and 1 capsule at night 12/19/19  Yes [provider]  rOPINIRole (REQUIP) 0.25 MG tablet Take 0.75 tablets by mouth daily after supper. 12/15/19  Yes [provider]  Testosterone 20.25 MG/ACT (1.62%) GEL Apply 1 Pump topically daily. Apply 1 pump to shoulder areas 11/22/18  Yes [provider]  tamsulosin (FLOMAX) 0.4 MG CAPS capsule TAKE 1 CAPSULE AT BEDTIME Patient not taking: Reported on 05/16/2020 10/07/19   Darren Gustin, MD    Physical Exam: BP 129/86 (BP Location: Left Arm)   Pulse 92   Temp 99.7 F (37.6 C) (Oral)   Resp 20   Ht 5\' 7"  (1.702 m)   Wt 86 kg   SpO2 100%   BMI 29.69 kg/m   . General: 54 y.o. year-old male well developed well nourished in no acute distress.  Alert and oriented x3. Marland Kitchen HEENT: NCAT, EOMI . Neck: Supple, trachea medial . Cardiovascular: Regular rate and rhythm with no rubs or gallops.  No thyromegaly or JVD noted.  No lower extremity edema. 2/4 pulses in all 4 extremities. Marland Kitchen Respiratory: Clear to auscultation with no wheezes or rales. Good inspiratory  effort. . Abdomen: Soft nontender nondistended with normal bowel sounds x4 quadrants, ostomy bag noted. . Muskuloskeletal: No cyanosis, clubbing or edema noted bilaterally . Neuro: CN II-XII intact, strength, sensation, reflexes . Skin: No ulcerative lesions noted or rashes . Psychiatry: Judgement and insight appear normal. Mood is appropriate for condition and setting          Labs on Admission:  Basic Metabolic Panel: Recent Labs  Lab 05/16/20 2124  NA 135  K 4.2  CL 102  CO2 22  GLUCOSE 125*  BUN 25*  CREATININE 1.48*  CALCIUM 10.1   Liver Function Tests: Recent Labs  Lab 05/16/20 2124  AST 16  ALT 18  ALKPHOS 86  BILITOT 0.7  PROT 7.1  ALBUMIN 4.0   Recent Labs  Lab 05/16/20 2124  LIPASE 27   No results for input(s): AMMONIA in the last 168 hours. CBC: Recent Labs  Lab 05/16/20 2124  WBC 9.9  NEUTROABS 8.4*  HGB 11.5*  HCT 37.5*  MCV 84.3  PLT 323   Cardiac Enzymes: No results for input(s): CKTOTAL,  CKMB, CKMBINDEX, TROPONINI in the last 168 hours.  BNP (last 3 results) No results for input(s): BNP in the last 8760 hours.  ProBNP (last 3 results) No results for input(s): PROBNP in the last 8760 hours.  CBG: No results for input(s): GLUCAP in the last 168 hours.  Radiological Exams on Admission: CT Abdomen Pelvis W Contrast  Result Date: 05/16/2020 CLINICAL DATA:  Abdominal distension. Fever. History of bladder and prostate cancer. EXAM: CT ABDOMEN AND PELVIS WITH CONTRAST TECHNIQUE: Multidetector CT imaging of the abdomen and pelvis was performed using the standard protocol following bolus administration of intravenous contrast. CONTRAST:  124mL OMNIPAQUE IOHEXOL 300 MG/ML  SOLN COMPARISON:  CT 04/29/2020 FINDINGS: Lower chest: Chronic basilar atelectasis. No confluent consolidation. No pleural effusion. Hepatobiliary: No focal hepatic abnormality. Calcified gallstone without pericholecystic inflammation. No biliary dilatation. Pancreas: No ductal  dilatation or inflammation. Spleen: Normal in size without focal abnormality. Adrenals/Urinary Tract: Left adrenal thickening with 11 mm nodule adjacent, series 2, image 26, not significant changed. No right adrenal nodule. Small cysts in the right kidney. No right hydronephrosis. The left kidney is small and irregular with innumerable cortical lesions of varying sizes and densities. There is been previous renal biopsy. Nonobstructing stone in the lower left kidney. The previous air adjacent to the kidney has resolved. Irregular soft tissue density extends along the renal pelvis and at the level of the renal vein. Left posterior perinephric nodule has increased currently measuring 15 mm, series 2, image 35, previously subcentimeter. There is stable dilatation of the left renal pelvis with layering stones. There are stones within the distal left ureter where it crosses the midline with mild ureteral dilatation, the stones are new from prior. The more distal aspect of the ileal conduit is decompressed. Prior cystectomy Stomach/Bowel: Fluid within the stomach. There are dilated loops of small bowel in the left abdomen. Fecalization of small bowel contents in the pelvis where there is transition in the right lower quadrant adjacent to the ileal conduit, series 2, image 63. This is similar in location to prior exam. The more distal small bowel is decompressed. There are enteric sutures within the distal ileum. Small to moderate volume of stool in the colon. No colonic wall thickening or inflammation. Vascular/Lymphatic: Aortic atherosclerosis without aneurysm. Patent portal vein. Soft tissue density in the left retroperitoneum adjacent to the left kidney appears contiguous with renal lesions rather than separate adenopathy. There multiple small lower periaortic nodes. No enlarged pelvic lymph nodes. Reproductive: Prostatectomy Other: Small amounts of ascites in the lower mesentery and pelvis, slightly increased from  prior exam. Small amount of right upper quadrant ascites is new. Ill-defined soft tissue density in the region of the prostate of cystectomy bed, series 2, image 85. Musculoskeletal: Small lucency in the right pubic body adjacent to the pubic symphysis with adjacent sclerotic focus. This is more conspicuous than on prior exam. IMPRESSION: 1. Small-bowel obstruction with transition point in the right lower quadrant adjacent to the ileal conduit, similar in location to prior exam. This may be due to adhesions, however the possibility of underlying mesenteric nodule as cause for obstruction is not excluded. 2. History of left renal neoplasm. Increased size of left posterior perinephric nodule, now measuring 15 mm, previously subcentimeter. This is suspicious for metastatic disease. 3. Atrophic left kidney with multiple ill-defined lesions. Left hydronephrosis with unchanged stones in the dilated left renal pelvis. There additional stones in the more distal left ureter at the level of the midline. These do not appear  to be causing hydronephrosis. 4. Cysto prostatectomy. There is ill-defined soft tissue density in the operative bed. 5. Small lucency in the right pubic body adjacent to the pubic symphysis with adjacent sclerotic focus, more conspicuous than on prior exam. This may represent a metastatic lesion, however is nonspecific. 6. Cholelithiasis without gallbladder inflammation. 7. Small amount of ascites in the lower mesentery and pelvis, slightly increased from prior exam. Small amount of right upper quadrant ascites is new. Aortic Atherosclerosis (ICD10-I70.0). Electronically Signed   By: Keith Rake M.D.   On: 05/16/2020 23:17   DG Chest Portable 1 View  Result Date: 05/17/2020 CLINICAL DATA:  NG tube placement. Post left renal biopsy, undergoing treatment for bladder cancer EXAM: PORTABLE CHEST 1 VIEW COMPARISON:  Radiograph 05/15/2019, CT 09/01/2019 FINDINGS: Transesophageal tube tip is seen  terminating in the left upper quadrant likely in the region of the gastric fundus with the side port beyond the margins of imaging, below the GE junction. Additional external tubing overlies the patient. Accessed left IJ approach Port-A-Cath tip terminates at the lower SVC. Bandlike opacity in the right mid lung likely reflecting subsegmental atelectasis. Additional streaky atelectatic changes in the lung bases. No focal consolidation or convincing features of edema. No concerning nodules or masses. No pneumothorax or visible effusion. Cardiomediastinal contours are unremarkable. No acute osseous or soft tissue abnormality. IMPRESSION: Transesophageal tube tip is seen in the left upper quadrant likely in the region of the gastric fundus with the side port beyond the margins of imaging, below the GE junction. Electronically Signed   By: Lovena Le M.D.   On: 05/17/2020 01:42    EKG: I independently viewed the EKG done and my findings are as followed: EKG was not done in the ED  Assessment/Plan Present on Admission: . Small bowel obstruction ([place]) . Bladder cancer Darren Brady)  Principal Problem:   Small bowel obstruction ([facility]) Active Problems:   Bladder cancer (H[facility])   History of pulmonary embolism   Abdominal pain   Nausea & vomiting  Abdominal pain, nausea and vomiting in the setting of small bowel obstruction Continue NG tube (100 cc of clear yellow fluid noted in the canister) Continue NPO at this time with plan to advanced diet as tolerated Continue IV hydration with NS at 79mls/Hr Continue IV morphine 2 mg q.4h p.r.n. for moderate/severe pain Continue Zofran p.r.n. for nausea/vomiting General surgery consult placed and we shall await the recommendation.  History of pulmonary embolism Patient was on Eliquis, this will be temporarily transitioned to therapeutic Lovenox due to patient being n.p.o. at this time  History of bladder cancer with metastasis CT abdomen pelvis with  contrast: Increased size of left posterior perinephric nodule, now measuring 15 mm, previously subcentimeter. This is suspicious for metastatic disease. Patient follows with [facility] and has been scheduled to restart chemotherapy on October 1st.  DVT prophylaxis: Lovenox  Code Status: Full Code  Family Communication: Wife at bedside (all questions answered to satisfaction)  Disposition Plan:  Patient is from:                        home Anticipated DC to:                   SNF or family members home Anticipated DC date:               2-3 days Anticipated DC barriers:         Patient unstable for discharge  at this time due to requiring NG tube secondary to small bowel obstruction.   Consults called: General surgery  Admission status: Inpatient  Bernadette Hoit MD [facility]  If 7PM-7AM, please contact night-coverage [email or web address] Password [email or web address]  05/17/2020, 2:07 AM

## 2020-05-17 NOTE — Progress Notes (Signed)
PROGRESS NOTE  [name]. 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  The patient was recently seen in the emergency department on 04/29/2020 where a CT of the abdomen and pelvis at that time showed a small bowel obstruction with transition point in the right lower quadrant.  Admission was recommended, but the patient did not want to be admitted.  He went home and placed himself on a clear liquid diet and used Ex-Lax for 24 hours after which the patient had a bowel movement with improvement of his symptoms.  He was doing fine until the evening of 05/15/2020 when he began having some abdominal pain after eating some low country boil.  His abdominal pain progressed and he began vomiting in the afternoon of 05/16/2020.  He states that he used a rectal suppository before he came to the emergency department resulting in a small bowel movement prior to coming to the emergency department.  He denies any fevers, chills, headache, chest pain, shortness breath, coughing, hemoptysis, diarrhea, hematochezia, melena, hematuria. In the emergency department, the patient had low-grade temperature of 99.7 F.  He was hemodynamically stable with oxygen saturation 95% room air.  BMP was unremarkable with serum creatinine 1.48 which is near his baseline.  LFTs were unremarkable.  WBC 9.9, hemoglobin 10.5, platelets 208,000.  CT of the abdomen and pelvis showed small bowel obstruction with transition zone in the right lower quadrant that was adjacent to the ileal conduit.  There is concern for adhesion secondary to a mesenteric nodule causing the SBO.  General surgery is  consulted to assist with management.  NG tube was placed with improvement of his abdominal pain and distention and vomiting.  Apparently, [facility] was contacted, but they were not accepting any transfers.  As result, the patient was admitted any pain hospital for further evaluation and treatment.  Assessment/Plan: Small bowel obstruction -Continue NG tube decompression -General surgery consult -N.p.o. for now -Continue IV fluids -Judicious opioids -Optimize electrolytes -05/16/2020 CT abdomen as discussed above  CKD stage IIIa -Baseline creatinine 1.3-1.5 -A.m. 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 -Status post cystoprostatectomy with ileal conduit May 2020 -Patient follows with medical oncology at [facility] -Patient has follow-up appointment on 05/20/2020 with possible restart of chemotherapy      Status is: Inpatient  Remains inpatient appropriate because:Ongoing diagnostic testing needed not appropriate for outpatient work up   Dispo: The patient is from: Home              Anticipated d/c is to: Home              Anticipated d/c date is: 2 days              Patient currently is not medically stable to d/c.        Family Communication:  no Family at bedside  Consultants:  General surgery  Code Status:  FULL  DVT Prophylaxis:  IV Heparin   Procedures: As Listed in Progress Note Above  Antibiotics: None   Total time spent 35 minutes.  Greater than 50% spent face to face counseling and coordinating care.    Subjective: Patient states abd pain is much improved with NG decompression.  Denies n/v/d.  Passing flatus but no BM.  Denies f/c, cp, sob,  n/v/d  Objective: Vitals:   05/17/20 0200 05/17/20 0400 05/17/20 0530 05/17/20 0600  BP: 122/83 124/73 124/73 121/84  Pulse: 85 80 73 75  Resp: 16 16 16 16   Temp:      TempSrc:      SpO2: 95% 93% 94% 95%  Weight:      Height:       No intake or output data in the 24 hours ending 05/17/20 0811 Weight change:  Exam:   General:  Pt is alert, follows commands appropriately, not in acute distress  HEENT: No icterus, No thrush, No neck mass, [place]/AT  Cardiovascular: RRR, S1/S2, no rubs, no gallops  Respiratory: CTA bilaterally, no wheezing, no crackles, no rhonchi  Abdomen: Soft/+BS, non tender, non distended, no guarding  Extremities: No edema, No lymphangitis, No petechiae, No rashes, no synovitis   Data Reviewed: I have personally reviewed following labs and imaging studies Basic Metabolic Panel: Recent Labs  Lab 05/16/20 2124 05/17/20 0534  NA 135 138  K 4.2 4.6  CL 102 101  CO2 22 25  GLUCOSE 125* 117*  BUN 25* 26*  CREATININE 1.48* 1.49*  CALCIUM 10.1 9.3  MG  --  2.1  PHOS  --  4.0   Liver Function Tests: Recent Labs  Lab 05/16/20 2124 05/17/20 0534  AST 16 13*  ALT 18 15  ALKPHOS 86 80  BILITOT 0.7 0.6  PROT 7.1 6.4*  ALBUMIN 4.0 3.6   Recent Labs  Lab 05/16/20 2124  LIPASE 27   No results for input(s): AMMONIA in the last 168 hours. Coagulation Profile: Recent Labs  Lab 05/17/20 0534  INR 1.3*   CBC: Recent Labs  Lab 05/16/20 2124 05/17/20 0534  WBC 9.9 6.4  NEUTROABS 8.4*  --   HGB 11.5* 10.6*  HCT 37.5* 35.1*  MCV 84.3 84.4  PLT 323 308   Cardiac Enzymes: No results for input(s): CKTOTAL, CKMB, CKMBINDEX, TROPONINI in the last 168 hours. BNP: Invalid input(s): POCBNP CBG: No results for input(s): GLUCAP in the last 168 hours. HbA1C: No results for input(s): HGBA1C in the last 72 hours. Urine analysis:    Component Value Date/Time   COLORURINE YELLOW 05/16/2020 2124   APPEARANCEUR CLOUDY (A) 05/16/2020 2124   LABSPEC  1.014 05/16/2020 2124   PHURINE 7.0 05/16/2020 2124   GLUCOSEU NEGATIVE 05/16/2020 2124   GLUCOSEU NEGATIVE 06/12/2011 1445   HGBUR SMALL (A) 05/16/2020 2124   BILIRUBINUR NEGATIVE 05/16/2020 2124   KETONESUR NEGATIVE 05/16/2020 2124   PROTEINUR 100 (A) 05/16/2020 2124   UROBILINOGEN 0.2 06/12/2011 1445   NITRITE NEGATIVE 05/16/2020 2124   LEUKOCYTESUR MODERATE (A) 05/16/2020 2124   Sepsis Labs: @LABRCNTIP (procalcitonin:4,lacticidven:4) ) Recent Results (from the past 240 hour(s))  Respiratory Panel by RT PCR (Flu A&B, Covid) - Nasopharyngeal Swab     Status: None   Collection Time: 05/16/20  8:53 PM   Specimen: Nasopharyngeal Swab  Result Value Ref Range Status   SARS Coronavirus 2 by RT PCR NEGATIVE NEGATIVE Final    Comment: (NOTE) SARS-CoV-2 target nucleic acids are NOT DETECTED.  The SARS-CoV-2 RNA is generally detectable  in upper respiratoy specimens during the acute phase of infection. The lowest concentration of SARS-CoV-2 viral copies this assay can detect is 131 copies/mL. A negative result does not preclude SARS-Cov-2 infection and should not be used as the sole basis for treatment or other patient management decisions. A negative result may occur with  improper specimen collection/handling, submission of specimen other than nasopharyngeal swab, presence of viral mutation(s) within the areas targeted by this assay, and inadequate number of viral copies (<131 copies/mL). A negative result must be combined with clinical observations, patient history, and epidemiological information. The expected result is Negative.  Fact Sheet for Patients:  PinkCheek.be  Fact Sheet for Healthcare Providers:  GravelBags.it  This test is no t yet approved or cleared by the [place] FDA and  has been authorized for detection and/or diagnosis of SARS-CoV-2 by FDA under an Emergency Use Authorization (EUA). This EUA will  remain  in effect (meaning this test can be used) for the duration of the COVID-19 declaration under Section 564(b)(1) of the Act, 21 U.S.C. section 360bbb-3(b)(1), unless the authorization is terminated or revoked sooner.     Influenza A by PCR NEGATIVE NEGATIVE Final   Influenza B by PCR NEGATIVE NEGATIVE Final    Comment: (NOTE) The Xpert Xpress SARS-CoV-2/FLU/RSV assay is intended as an aid in  the diagnosis of influenza from Nasopharyngeal swab specimens and  should not be used as a sole basis for treatment. Nasal washings and  aspirates are unacceptable for Xpert Xpress SARS-CoV-2/FLU/RSV  testing.  Fact Sheet for Patients: PinkCheek.be  Fact Sheet for Healthcare Providers: GravelBags.it  This test is not yet approved or cleared by the [place] FDA and  has been authorized for detection and/or diagnosis of SARS-CoV-2 by  FDA under an Emergency Use Authorization (EUA). This EUA will remain  in effect (meaning this test can be used) for the duration of the  Covid-19 declaration under Section 564(b)(1) of the Act, 21  U.S.C. section 360bbb-3(b)(1), unless the authorization is  terminated or revoked. Performed at [facility], [address] [place], [place] [address]      Scheduled Meds: . enoxaparin (LOVENOX) injection  85 mg Subcutaneous Q12H   Continuous Infusions:  Procedures/Studies: CT Abdomen Pelvis W Contrast  Result Date: 05/16/2020 CLINICAL DATA:  Abdominal distension. Fever. History of bladder and prostate cancer. EXAM: CT ABDOMEN AND PELVIS WITH CONTRAST TECHNIQUE: Multidetector CT imaging of the abdomen and pelvis was performed using the standard protocol following bolus administration of intravenous contrast. CONTRAST:  150mL OMNIPAQUE IOHEXOL 300 MG/ML  SOLN COMPARISON:  CT 04/29/2020 FINDINGS: Lower chest: Chronic basilar atelectasis. No confluent consolidation. No pleural effusion.  Hepatobiliary: No focal hepatic abnormality. Calcified gallstone without pericholecystic inflammation. No biliary dilatation. Pancreas: No ductal dilatation or inflammation. Spleen: Normal in size without focal abnormality. Adrenals/Urinary Tract: Left adrenal thickening with 11 mm nodule adjacent, series 2, image 26, not significant changed. No right adrenal nodule. Small cysts in the right kidney. No right hydronephrosis. The left kidney is small and irregular with innumerable cortical lesions of varying sizes and densities. There is been previous renal biopsy. Nonobstructing stone in the lower left kidney. The previous air adjacent to the kidney has resolved. Irregular soft tissue density extends along the renal pelvis and at the level of the renal vein. Left posterior perinephric nodule has increased currently measuring 15 mm, series 2, image 35, previously subcentimeter. There is stable dilatation of the left renal pelvis with layering stones. There are stones within the  distal left ureter where it crosses the midline with mild ureteral dilatation, the stones are new from prior. The more distal aspect of the ileal conduit is decompressed. Prior cystectomy Stomach/Bowel: Fluid within the stomach. There are dilated loops of small bowel in the left abdomen. Fecalization of small bowel contents in the pelvis where there is transition in the right lower quadrant adjacent to the ileal conduit, series 2, image 63. This is similar in location to prior exam. The more distal small bowel is decompressed. There are enteric sutures within the distal ileum. Small to moderate volume of stool in the colon. No colonic wall thickening or inflammation. Vascular/Lymphatic: Aortic atherosclerosis without aneurysm. Patent portal vein. Soft tissue density in the left retroperitoneum adjacent to the left kidney appears contiguous with renal lesions rather than separate adenopathy. There multiple small lower periaortic nodes. No  enlarged pelvic lymph nodes. Reproductive: Prostatectomy Other: Small amounts of ascites in the lower mesentery and pelvis, slightly increased from prior exam. Small amount of right upper quadrant ascites is new. Ill-defined soft tissue density in the region of the prostate of cystectomy bed, series 2, image 85. Musculoskeletal: Small lucency in the right pubic body adjacent to the pubic symphysis with adjacent sclerotic focus. This is more conspicuous than on prior exam. IMPRESSION: 1. Small-bowel obstruction with transition point in the right lower quadrant adjacent to the ileal conduit, similar in location to prior exam. This may be due to adhesions, however the possibility of underlying mesenteric nodule as cause for obstruction is not excluded. 2. History of left renal neoplasm. Increased size of left posterior perinephric nodule, now measuring 15 mm, previously subcentimeter. This is suspicious for metastatic disease. 3. Atrophic left kidney with multiple ill-defined lesions. Left hydronephrosis with unchanged stones in the dilated left renal pelvis. There additional stones in the more distal left ureter at the level of the midline. These do not appear to be causing hydronephrosis. 4. Cysto prostatectomy. There is ill-defined soft tissue density in the operative bed. 5. Small lucency in the right pubic body adjacent to the pubic symphysis with adjacent sclerotic focus, more conspicuous than on prior exam. This may represent a metastatic lesion, however is nonspecific. 6. Cholelithiasis without gallbladder inflammation. 7. Small amount of ascites in the lower mesentery and pelvis, slightly increased from prior exam. Small amount of right upper quadrant ascites is new. Aortic Atherosclerosis (ICD10-I70.0). Electronically Signed   By: Keith Rake M.D.   On: 05/16/2020 23:17   CT ABDOMEN PELVIS W CONTRAST  Result Date: 04/29/2020 CLINICAL DATA:  Abdominal distension with nausea. Acute nonlocalized  abdominal pain. Patient had renal biopsy yesterday at [facility]. History of bladder and prostate cancer. EXAM: CT ABDOMEN AND PELVIS WITH CONTRAST TECHNIQUE: Multidetector CT imaging of the abdomen and pelvis was performed using the standard protocol following bolus administration of intravenous contrast. CONTRAST:  168mL OMNIPAQUE IOHEXOL 300 MG/ML  SOLN COMPARISON:  Most recent abdominopelvic CT available 09/01/2019. Report from abdominal CT 04/16/2020 performed at an outside institution reviewed. FINDINGS: Lower chest: Linear atelectasis in the left greater than right lower lobe. No pleural fluid. No basilar pneumothorax. Hepatobiliary: No focal liver abnormality is seen. No gallstones, gallbladder wall thickening, or biliary dilatation. Pancreas: No ductal dilatation or inflammation. Spleen: Homogeneous density without focal abnormality. No perisplenic bleed. Adrenals/Urinary Tract: Normal right adrenal gland. Ill-defined density adjacent to the left adrenal gland measures approximately 11 mm, series 2, image 23. Atrophic left renal parenchyma with ill-defined corticomedullary definity shin and renal parenchymal atrophy. Lesion in  the lower pole measures approximately 19 mm and has internal focus of air, may represent site of biopsy. There is no evidence of adjacent hemorrhage. There is an exophytic lesion from the upper left kidney measuring 2.8 cm. There are nonobstructing stones in the lower kidney. There is left hydronephrosis with layering stones in the upper left ureter, series 2, image 40, the stones are nonobstructing. More distal ureter is dilated to the ileal conduit. There is no definite ureteral thickening or inflammatory change. No evidence of post biopsy hemorrhage. Mild prominence of the right renal collecting system tapering to the ileal conduit without frank hydronephrosis. Small low-density lesions in the right kidney likely represent cysts. Cystectomy with right lower quadrant ileal conduit.  Stomach/Bowel: Stomach distended with intraluminal fluid. There is a tiny hiatal hernia. Dilated fluid-filled bowel loops with fecalization of small bowel contents in the lower abdomen. Transition point in the right lower quadrant, series 2, image 60 and series 5, image 49. Transition is adjacent to the ileal conduit. There is mesenteric edema and small amount of free fluid in the region of most prominent small bowel dilatation. No evidence of bowel pneumatosis. Enteric sutures are noted in the more distal small bowel. More distal small bowel is decompressed. Appendix not visualized. Small volume of colonic stool. Vascular/Lymphatic: Aortic atherosclerosis. No aortic aneurysm. Patent portal vein. Left periaortic node measures 15 mm, series 2, image 35. Tiny left perinephric nodule, series 2, image 33. There is no pelvic adenopathy. Reproductive: Prostatectomy. Other: Small amount of left retroperitoneal air likely related to renal biopsy. Small amount of ascites and free fluid scattered throughout the mesentery and in the pelvis. There is no evidence of omental thickening. There is no free air. Small fat containing umbilical hernia. Musculoskeletal: No acute osseous abnormality or focal bone lesion. No evidence of intramuscular bleed post recent renal biopsy. IMPRESSION: 1. Small-bowel obstruction with transition point in the right lower quadrant, likely secondary to adhesions. Site of obstruction is adjacent to ileal conduit. 2. History of left renal neoplasm and biopsy yesterday at an outside institution. Small amount of air within the left lower pole lesion and in the left retroperitoneum is likely related to recent biopsy. There is no evidence of post biopsy hemorrhage. Staging of malignancy is difficult due to lack of direct comparison, and reference outside CT for complete malignancy staging. 3. Left hydronephrosis with layering stones in the upper left ureter, the stones are nonobstructing. More distal ureter  is dilated to the ileal conduit without definite ureteral thickening or inflammatory change. 4. Left periaortic adenopathy. 5. Cysto prostatectomy with ileal conduit. 6. Small volume of free fluid and ascites. Aortic Atherosclerosis (ICD10-I70.0). Electronically Signed   By: Keith Rake M.D.   On: 04/29/2020 18:43   DG Chest Portable 1 View  Result Date: 05/17/2020 CLINICAL DATA:  NG tube placement. Post left renal biopsy, undergoing treatment for bladder cancer EXAM: PORTABLE CHEST 1 VIEW COMPARISON:  Radiograph 05/15/2019, CT 09/01/2019 FINDINGS: Transesophageal tube tip is seen terminating in the left upper quadrant likely in the region of the gastric fundus with the side port beyond the margins of imaging, below the GE junction. Additional external tubing overlies the patient. Accessed left IJ approach Port-A-Cath tip terminates at the lower SVC. Bandlike opacity in the right mid lung likely reflecting subsegmental atelectasis. Additional streaky atelectatic changes in the lung bases. No focal consolidation or convincing features of edema. No concerning nodules or masses. No pneumothorax or visible effusion. Cardiomediastinal contours are unremarkable. No acute osseous  or soft tissue abnormality. IMPRESSION: Transesophageal tube tip is seen in the left upper quadrant likely in the region of the gastric fundus with the side port beyond the margins of imaging, below the GE junction. Electronically Signed   By: Lovena Le M.D.   On: 05/17/2020 01:42    Orson Eva, DO  [facility]  If 7PM-7AM, please contact night-coverage [email or web address] Password [email or web address] 05/17/2020, 8:11 AM   LOS: 0 days

## 2020-05-17 NOTE — Consult Note (Signed)
[facility] Consult  Reason for Consult: SBO  Referring Physician:  Dr. Carles Collet   Chief Complaint    Fever      HPI: Darren Brady. is a 54 y.o. 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 He subsequently has transferred his care to [facility] and is followed by Oncology, Dr. Aline Brochure. He has had chemotherapy and immunotherapy in the past and is due to start back chemotherapy for his recurrence later this week. He has had issues with abdominal pain, distention and constipation earlier this month and was diagnosed with SBO in the ED. He could not get transferred and was feeling better, and opted to go home with a liquid diet and bowel regimen. He did this and says that in a few days he was back to normal having BMs and eating normally. Prior to this month he has never had a SBO.   He comes back in with abdominal pain, distention, nausea/vomiting overnight. He says he is better now after NG tube placement. He says he is having flatus.  CT done in ED demonstrated dilated SB and fecalization with transition in right lower pelvis and potential this is from adhesions versus metastatic nodules.   He is scheduled to see Oncology 10/1 for chemotherapy he reports.   Past Medical History:  Diagnosis Date  . Bladder tumor   . Cancer [facility])    bladder cancer  . GERD (gastroesophageal reflux disease)    occasional after taking flomax  . Hypogonadism male   . Nocturia   . Seasonal allergies   . Wears glasses     Past Surgical History:  Procedure Laterality Date  . CARDIAC CATHETERIZATION  11-21-2002  dr w. Albertine Patricia  @[facility]    normal coronary arteries,  ef 60%  . CYSTOSCOPY WITH INJECTION N/A 01/01/2019   Procedure: CYSTOSCOPY WITH INJECTION;  Surgeon: Alexis Frock, MD;  Location: [facility]S;  Service: Urology;  Laterality: N/A;  . ROBOT  ASSISTED LAPAROSCOPIC COMPLETE CYSTECT ILEAL CONDUIT  12/2018   Robot-assisted lap radical cystoprostatectomy with ileal conduit and bilateral pelvic lymphadenectomy at [facility]  . TRANSURETHRAL RESECTION OF BLADDER TUMOR N/A 12/02/2018   Procedure: TRANSURETHRAL RESECTION OF BLADDER TUMOR (TURBT);  Surgeon: Cleon Gustin, MD;  Location: [facility];  Service: Urology;  Laterality: N/A;  30 MINS    Family History  Problem Relation Age of Onset  . Hyperlipidemia Other   . Hypertension Other   . Kidney disease Other   . Thyroid disease Other   . Coronary artery disease Other   . Cancer Neg Hx     Social History   Tobacco Use  . Smoking status: Never Smoker  . Smokeless tobacco: Current User    Types: Snuff  . Tobacco comment: per pt dip tobacco since teen   Vaping Use  . Vaping Use: Never used  Substance Use Topics  . Alcohol use: Yes    Alcohol/week: 7.0 standard drinks    Types: 7 Cans of beer per week    Comment: one beer occ  . Drug use: Never    Medications: I have reviewed the patient's current medications. Current Facility-Administered Medications  Medication Dose Route Frequency Provider Last Rate Last Admin  . Chlorhexidine Gluconate Cloth 2 % PADS 6 each  6 each Topical Daily Tat, David, MD      . heparin ADULT infusion 100 units/mL (25000 units/252mL  sodium chloride 0.45%)  1,200 Units/hr Intravenous Continuous Tat, David, MD      . morphine 2 MG/ML injection 2 mg  2 mg Intravenous Q4H PRN Adefeso, Oladapo, DO   2 mg at 05/17/20 1112  . ondansetron (ZOFRAN) injection 4 mg  4 mg Intravenous Q6H PRN Adefeso, Oladapo, DO      . phenol (CHLORASEPTIC) mouth spray 1 spray  1 spray Mouth/Throat PRN Tat, David, MD   1 spray at 05/17/20 1107    No Known Allergies   ROS:  A comprehensive review of systems was negative except for: Gastrointestinal: positive for abdominal pain, constipation, nausea and vomiting  Blood pressure 106/80, pulse 63, temperature 98.3 F (36.8 C),  temperature source Oral, resp. rate 16, height 5\' 7"  (1.702 m), weight 85.5 kg, SpO2 94 %. Physical Exam Vitals reviewed.  Constitutional:      Appearance: Normal appearance.  HENT:     Head: Normocephalic.     Nose: Nose normal.     Mouth/Throat:     Mouth: Mucous membranes are moist.  Eyes:     Extraocular Movements: Extraocular movements intact.  Cardiovascular:     Rate and Rhythm: Normal rate.  Pulmonary:     Effort: Pulmonary effort is normal.  Abdominal:     General: There is no distension.     Palpations: Abdomen is soft.     Tenderness: There is no abdominal tenderness.  Musculoskeletal:        General: Normal range of motion.     Cervical back: Normal range of motion.  Skin:    General: Skin is warm.  Neurological:     General: No focal deficit present.     Mental Status: He is alert and oriented to person, place, and time.  Psychiatric:        Mood and Affect: Mood normal.        Behavior: Behavior normal.        Thought Content: Thought content normal.        Judgment: Judgment normal.     Results: Results for orders placed or performed during the hospital encounter of 05/16/20 (from the past 48 hour(s))  Respiratory Panel by RT PCR (Flu A&B, Covid) - Nasopharyngeal Swab     Status: None   Collection Time: 05/16/20  8:53 PM   Specimen: Nasopharyngeal Swab  Result Value Ref Range   SARS Coronavirus 2 by RT PCR NEGATIVE NEGATIVE    Comment: (NOTE) SARS-CoV-2 target nucleic acids are NOT DETECTED.  The SARS-CoV-2 RNA is generally detectable in upper respiratoy specimens during the acute phase of infection. The lowest concentration of SARS-CoV-2 viral copies this assay can detect is 131 copies/mL. A negative result does not preclude SARS-Cov-2 infection and should not be used as the sole basis for treatment or other patient management decisions. A negative result may occur with  improper specimen collection/handling, submission of specimen other than  nasopharyngeal swab, presence of viral mutation(s) within the areas targeted by this assay, and inadequate number of viral copies (<131 copies/mL). A negative result must be combined with clinical observations, patient history, and epidemiological information. The expected result is Negative.  Fact Sheet for Patients:  PinkCheek.be  Fact Sheet for Healthcare Providers:  GravelBags.it  This test is no t yet approved or cleared by the [place] FDA and  has been authorized for detection and/or diagnosis of SARS-CoV-2 by FDA under an Emergency Use Authorization (EUA). This EUA will remain  in effect (meaning this test  can be used) for the duration of the COVID-19 declaration under Section 564(b)(1) of the Act, 21 U.S.C. section 360bbb-3(b)(1), unless the authorization is terminated or revoked sooner.     Influenza A by PCR NEGATIVE NEGATIVE   Influenza B by PCR NEGATIVE NEGATIVE    Comment: (NOTE) The Xpert Xpress SARS-CoV-2/FLU/RSV assay is intended as an aid in  the diagnosis of influenza from Nasopharyngeal swab specimens and  should not be used as a sole basis for treatment. Nasal washings and  aspirates are unacceptable for Xpert Xpress SARS-CoV-2/FLU/RSV  testing.  Fact Sheet for Patients: PinkCheek.be  Fact Sheet for Healthcare Providers: GravelBags.it  This test is not yet approved or cleared by the [place] FDA and  has been authorized for detection and/or diagnosis of SARS-CoV-2 by  FDA under an Emergency Use Authorization (EUA). This EUA will remain  in effect (meaning this test can be used) for the duration of the  Covid-19 declaration under Section 564(b)(1) of the Act, 21  U.S.C. section 360bbb-3(b)(1), unless the authorization is  terminated or revoked. Performed at [facility], [address], [place], [place] [address]   CBC with  Differential     Status: Abnormal   Collection Time: 05/16/20  9:24 PM  Result Value Ref Range   WBC 9.9 4.0 - 10.5 K/uL   RBC 4.45 4.22 - 5.81 MIL/uL   Hemoglobin 11.5 (L) 13.0 - 17.0 g/dL   HCT 37.5 (L) 39 - 52 %   MCV 84.3 80.0 - 100.0 fL   MCH 25.8 (L) 26.0 - 34.0 pg   MCHC 30.7 30.0 - 36.0 g/dL   RDW 14.2 11.5 - 15.5 %   Platelets 323 150 - 400 K/uL   nRBC 0.0 0.0 - 0.2 %   Neutrophils Relative % 85 %   Neutro Abs 8.4 (H) 1.7 - 7.7 K/uL   Lymphocytes Relative 8 %   Lymphs Abs 0.8 0.7 - 4.0 K/uL   Monocytes Relative 6 %   Monocytes Absolute 0.6 0 - 1 K/uL   Eosinophils Relative 1 %   Eosinophils Absolute 0.1 0 - 0 K/uL   Basophils Relative 0 %   Basophils Absolute 0.0 0 - 0 K/uL   Immature Granulocytes 0 %   Abs Immature Granulocytes 0.02 0.00 - 0.07 K/uL    Comment: Performed at [facility], [address] [place], [place] [address]  Comprehensive metabolic panel     Status: Abnormal   Collection Time: 05/16/20  9:24 PM  Result Value Ref Range   Sodium 135 135 - 145 mmol/L   Potassium 4.2 3.5 - 5.1 mmol/L   Chloride 102 98 - 111 mmol/L   CO2 22 22 - 32 mmol/L   Glucose, Bld 125 (H) 70 - 99 mg/dL    Comment: Glucose reference range applies only to samples taken after fasting for at least 8 hours.   BUN 25 (H) 6 - 20 mg/dL   Creatinine, Ser 1.48 (H) 0.61 - 1.24 mg/dL   Calcium 10.1 8.9 - 10.3 mg/dL   Total Protein 7.1 6.5 - 8.1 g/dL   Albumin 4.0 3.5 - 5.0 g/dL   AST 16 15 - 41 U/L   ALT 18 0 - 44 U/L   Alkaline Phosphatase 86 38 - 126 U/L   Total Bilirubin 0.7 0.3 - 1.2 mg/dL   GFR calc non Af Amer 53 (L) >60 mL/min   GFR calc Af Amer >60 >60 mL/min   Anion gap 11 5 - 15  Comment: Performed at [facility], [address], [place], [place] [address]  Lipase, blood     Status: None   Collection Time: 05/16/20  9:24 PM  Result Value Ref Range   Lipase 27 11 - 51 U/L    Comment: Performed at [facility], [address], [place], [place] [address]  Urinalysis,  Routine w reflex microscopic Urine, Suprapubic     Status: Abnormal   Collection Time: 05/16/20  9:24 PM  Result Value Ref Range   Color, Urine YELLOW YELLOW   APPearance CLOUDY (A) CLEAR   Specific Gravity, Urine 1.014 1.005 - 1.030   pH 7.0 5.0 - 8.0   Glucose, UA NEGATIVE NEGATIVE mg/dL   Hgb urine dipstick SMALL (A) NEGATIVE   Bilirubin Urine NEGATIVE NEGATIVE   Ketones, ur NEGATIVE NEGATIVE mg/dL   Protein, ur 100 (A) NEGATIVE mg/dL   Nitrite NEGATIVE NEGATIVE   Leukocytes,Ua MODERATE (A) NEGATIVE   RBC / HPF 11-20 0 - 5 RBC/hpf   WBC, UA >50 (H) 0 - 5 WBC/hpf   Bacteria, UA RARE (A) NONE SEEN   Squamous Epithelial / LPF 0-5 0 - 5   Mucus PRESENT     Comment: Performed at [facility], [address], [place], [place] [address]  HIV Antibody (routine testing w rflx)     Status: None   Collection Time: 05/17/20  5:34 AM  Result Value Ref Range   HIV Screen 4th Generation wRfx Non Reactive Non Reactive    Comment: Performed at [facility] [address] [place], [place] [address]  Comprehensive metabolic panel     Status: Abnormal   Collection Time: 05/17/20  5:34 AM  Result Value Ref Range   Sodium 138 135 - 145 mmol/L   Potassium 4.6 3.5 - 5.1 mmol/L   Chloride 101 98 - 111 mmol/L   CO2 25 22 - 32 mmol/L   Glucose, Bld 117 (H) 70 - 99 mg/dL    Comment: Glucose reference range applies only to samples taken after fasting for at least 8 hours.   BUN 26 (H) 6 - 20 mg/dL   Creatinine, Ser 1.49 (H) 0.61 - 1.24 mg/dL   Calcium 9.3 8.9 - 10.3 mg/dL   Total Protein 6.4 (L) 6.5 - 8.1 g/dL   Albumin 3.6 3.5 - 5.0 g/dL   AST 13 (L) 15 - 41 U/L   ALT 15 0 - 44 U/L   Alkaline Phosphatase 80 38 - 126 U/L   Total Bilirubin 0.6 0.3 - 1.2 mg/dL   GFR calc non Af Amer 52 (L) >60 mL/min   GFR calc Af Amer >60 >60 mL/min   Anion gap 12 5 - 15    Comment: Performed at [facility], [address], [place], [place] [address]  CBC     Status: Abnormal   Collection Time: 05/17/20   5:34 AM  Result Value Ref Range   WBC 6.4 4.0 - 10.5 K/uL   RBC 4.16 (L) 4.22 - 5.81 MIL/uL   Hemoglobin 10.6 (L) 13.0 - 17.0 g/dL   HCT 35.1 (L) 39 - 52 %   MCV 84.4 80.0 - 100.0 fL   MCH 25.5 (L) 26.0 - 34.0 pg   MCHC 30.2 30.0 - 36.0 g/dL   RDW 14.5 11.5 - 15.5 %   Platelets 308 150 - 400 K/uL   nRBC 0.0 0.0 - 0.2 %    Comment: Performed at [facility], [address], [place], [place] [address]  Protime-INR     Status: Abnormal   Collection Time: 05/17/20  5:34 AM  Result Value Ref Range   Prothrombin Time 15.3 (H) 11.4 - 15.2 seconds   INR 1.3 (H) 0.8 - 1.2    Comment: (NOTE) INR goal varies based on device and disease states. Performed at [facility], [address], [place], [place] [address]   APTT     Status: None   Collection Time: 05/17/20  5:34 AM  Result Value Ref Range   aPTT 35 24 - 36 seconds    Comment: Performed at [facility], [address], [place], [place] [address]  Magnesium     Status: None   Collection Time: 05/17/20  5:34 AM  Result Value Ref Range   Magnesium 2.1 1.7 - 2.4 mg/dL    Comment: Performed at [facility], [address] [place], [place] [address]  Phosphorus     Status: None   Collection Time: 05/17/20  5:34 AM  Result Value Ref Range   Phosphorus 4.0 2.5 - 4.6 mg/dL    Comment: Performed at [facility], [address], [place], [place] [address]  Heparin level (unfractionated)     Status: Abnormal   Collection Time: 05/17/20  5:34 AM  Result Value Ref Range   Heparin Unfractionated 2.06 (H) 0.30 - 0.70 IU/mL    Comment: RESULTS CONFIRMED BY MANUAL DILUTION Performed at [facility], [address] [place], [place] [address]    Personally reviewed CT and prior- dilated SB with fecalization and transition in the pelvis. Difficult to know if from scar versus nodules. Stool in colon  CT Abdomen Pelvis W Contrast  Result Date: 05/16/2020 CLINICAL DATA:  Abdominal distension. Fever. History of bladder and prostate cancer. EXAM: CT  ABDOMEN AND PELVIS WITH CONTRAST TECHNIQUE: Multidetector CT imaging of the abdomen and pelvis was performed using the standard protocol following bolus administration of intravenous contrast. CONTRAST:  166mL OMNIPAQUE IOHEXOL 300 MG/ML  SOLN COMPARISON:  CT 04/29/2020 FINDINGS: Lower chest: Chronic basilar atelectasis. No confluent consolidation. No pleural effusion. Hepatobiliary: No focal hepatic abnormality. Calcified gallstone without pericholecystic inflammation. No biliary dilatation. Pancreas: No ductal dilatation or inflammation. Spleen: Normal in size without focal abnormality. Adrenals/Urinary Tract: Left adrenal thickening with 11 mm nodule adjacent, series 2, image 26, not significant changed. No right adrenal nodule. Small cysts in the right kidney. No right hydronephrosis. The left kidney is small and irregular with innumerable cortical lesions of varying sizes and densities. There is been previous renal biopsy. Nonobstructing stone in the lower left kidney. The previous air adjacent to the kidney has resolved. Irregular soft tissue density extends along the renal pelvis and at the level of the renal vein. Left posterior perinephric nodule has increased currently measuring 15 mm, series 2, image 35, previously subcentimeter. There is stable dilatation of the left renal pelvis with layering stones. There are stones within the distal left ureter where it crosses the midline with mild ureteral dilatation, the stones are new from prior. The more distal aspect of the ileal conduit is decompressed. Prior cystectomy Stomach/Bowel: Fluid within the stomach. There are dilated loops of small bowel in the left abdomen. Fecalization of small bowel contents in the pelvis where there is transition in the right lower quadrant adjacent to the ileal conduit, series 2, image 63. This is similar in location to prior exam. The more distal small bowel is decompressed. There are enteric sutures within the distal ileum.  Small to moderate volume of stool in  the colon. No colonic wall thickening or inflammation. Vascular/Lymphatic: Aortic atherosclerosis without aneurysm. Patent portal vein. Soft tissue density in the left retroperitoneum adjacent to the left kidney appears contiguous with renal lesions rather than separate adenopathy. There multiple small lower periaortic nodes. No enlarged pelvic lymph nodes. Reproductive: Prostatectomy Other: Small amounts of ascites in the lower mesentery and pelvis, slightly increased from prior exam. Small amount of right upper quadrant ascites is new. Ill-defined soft tissue density in the region of the prostate of cystectomy bed, series 2, image 85. Musculoskeletal: Small lucency in the right pubic body adjacent to the pubic symphysis with adjacent sclerotic focus. This is more conspicuous than on prior exam. IMPRESSION: 1. Small-bowel obstruction with transition point in the right lower quadrant adjacent to the ileal conduit, similar in location to prior exam. This may be due to adhesions, however the possibility of underlying mesenteric nodule as cause for obstruction is not excluded. 2. History of left renal neoplasm. Increased size of left posterior perinephric nodule, now measuring 15 mm, previously subcentimeter. This is suspicious for metastatic disease. 3. Atrophic left kidney with multiple ill-defined lesions. Left hydronephrosis with unchanged stones in the dilated left renal pelvis. There additional stones in the more distal left ureter at the level of the midline. These do not appear to be causing hydronephrosis. 4. Cysto prostatectomy. There is ill-defined soft tissue density in the operative bed. 5. Small lucency in the right pubic body adjacent to the pubic symphysis with adjacent sclerotic focus, more conspicuous than on prior exam. This may represent a metastatic lesion, however is nonspecific. 6. Cholelithiasis without gallbladder inflammation. 7. Small amount of ascites in  the lower mesentery and pelvis, slightly increased from prior exam. Small amount of right upper quadrant ascites is new. Aortic Atherosclerosis (ICD10-I70.0). Electronically Signed   By: Keith Rake M.D.   On: 05/16/2020 23:17   DG Chest Portable 1 View  Result Date: 05/17/2020 CLINICAL DATA:  NG tube placement. Post left renal biopsy, undergoing treatment for bladder cancer EXAM: PORTABLE CHEST 1 VIEW COMPARISON:  Radiograph 05/15/2019, CT 09/01/2019 FINDINGS: Transesophageal tube tip is seen terminating in the left upper quadrant likely in the region of the gastric fundus with the side port beyond the margins of imaging, below the GE junction. Additional external tubing overlies the patient. Accessed left IJ approach Port-A-Cath tip terminates at the lower SVC. Bandlike opacity in the right mid lung likely reflecting subsegmental atelectasis. Additional streaky atelectatic changes in the lung bases. No focal consolidation or convincing features of edema. No concerning nodules or masses. No pneumothorax or visible effusion. Cardiomediastinal contours are unremarkable. No acute osseous or soft tissue abnormality. IMPRESSION: Transesophageal tube tip is seen in the left upper quadrant likely in the region of the gastric fundus with the side port beyond the margins of imaging, below the GE junction. Electronically Signed   By: Lovena Le M.D.   On: 05/17/2020 01:42     Assessment & Plan:  Darren Brady. is a 54 y.o. male with SBO in the setting of recurrent metastastic squamous cell bladder cancer s/p robotic assisted radical cystoprotectomy and lymph node harvest 12/2018. His SBO could be from adhesions or from metastatic disease.  He has now had two episodes in 1 month but it is difficult to say if this episode really ever resolved. Discussed with patient plan for NPO, NG. Discussed potential that this could be from metastatic disease and that this may ultimately need surgery. Discussed that  this would  best be done with Urology and General versus Surgical Oncology if needed. Discussed that his Oncologist may also think the SBO would get better with tumor shrinkage if not from adhesions if he can make it to that treatment.   -NPO, NG  -Agree with heparin gtt while in hospital -He is going to let his Oncologist, Dr. Aline Brochure at [facility] know about the situation and that he is here -Would send home with CD of the CT scans if he is able to get discharged, patient aware that this could potentially recur and is likely to if this is from metastatic disease   All questions were answered to the satisfaction of the patient.   Virl Cagey 05/17/2020, 11:39 AM

## 2020-05-18 LAB — CBC
HCT: 31.7 % — ABNORMAL LOW (ref 39.0–52.0)
Hemoglobin: 9.6 g/dL — ABNORMAL LOW (ref 13.0–17.0)
MCH: 25.9 pg — ABNORMAL LOW (ref 26.0–34.0)
MCHC: 30.3 g/dL (ref 30.0–36.0)
MCV: 85.7 fL (ref 80.0–100.0)
Platelets: 243 10*3/uL (ref 150–400)
RBC: 3.7 MIL/uL — ABNORMAL LOW (ref 4.22–5.81)
RDW: 14.3 % (ref 11.5–15.5)
WBC: 5.7 10*3/uL (ref 4.0–10.5)
nRBC: 0 % (ref 0.0–0.2)

## 2020-05-18 LAB — HEPARIN LEVEL (UNFRACTIONATED): Heparin Unfractionated: 1.05 IU/mL — ABNORMAL HIGH (ref 0.30–0.70)

## 2020-05-18 LAB — APTT: aPTT: 99 seconds — ABNORMAL HIGH (ref 24–36)

## 2020-05-18 MED ORDER — HEPARIN SOD (PORK) LOCK FLUSH 100 UNIT/ML IV SOLN
500.0000 [IU] | Freq: Once | INTRAVENOUS | Status: AC
  Administered 2020-05-18: 500 [IU] via INTRAVENOUS
  Filled 2020-05-18: qty 5

## 2020-05-18 NOTE — Progress Notes (Signed)
Patient leaving AMA.  Patient is alert and oriented.  Patient demanding that he be discharged AMA.  Patient wanting to leave to go to [facility] where his oncologist practices.  Both Dr. Constance Haw and Dr. Carles Collet spoke to the patient about the consequences of leaving AMA.  Patient's heparin was stopped and chest port was heparin flushed and deaccessed.  Patient walked out of the hospital with his wife.

## 2020-05-18 NOTE — Progress Notes (Signed)
[facility]  Spoke with patient having flatus but no BM. He said he drank some water while off NG suction while walking and tolerated that yesterday. He wants to leave and does not feel like we are providing the appropriate care. I explained that NPO and NG were the standards of care and that he is at risk for this not resolving or coming back and discussed again that this could be scar or cancer causing his blockage. He wants to get to his physicians at [facility] which I agree will be best in the long term but explained that they would likely not do anything differently at this point unless they determine the blockage is from cancer.  Offered him to be able to stay and do clears. He is ready to leave and signed paperwork. Discussed with RN and Dr. Carles Collet.   Darren Labrum, MD [facility] [address] [place], [place] [address] [phone] (office)

## 2020-05-18 NOTE — Progress Notes (Signed)
ANTICOAGULATION CONSULT NOTE   Pharmacy Consult for heparin IV Indication: hx of pulmonary embolus  No Known Allergies  Patient Measurements: Height: 5\' 7"  (170.2 cm) Weight: 85.5 kg (188 lb 7.9 oz) IBW/kg (Calculated) : 66.1  Heparin DW: 83.5 kg    Vital Signs: Temp: 99.1 F (37.3 C) (09/27 2140) Temp Source: Oral (09/27 2140) BP: 116/79 (09/27 2140) Pulse Rate: 81 (09/27 2140)  Labs: Recent Labs    05/16/20 2124 05/16/20 2124 05/17/20 0534 05/17/20 2008 05/18/20 0238  HGB 11.5*   < > 10.6*  --  9.6*  HCT 37.5*  --  35.1*  --  31.7*  PLT 323  --  308  --  243  APTT  --   --  35 71* 99*  LABPROT  --   --  15.3*  --   --   INR  --   --  1.3*  --   --   HEPARINUNFRC  --   --  2.06*  --  1.05*  CREATININE 1.48*  --  1.49*  --   --    < > = values in this interval not displayed.    Estimated Creatinine Clearance: 58.6 mL/min (A) (by C-G formula based on SCr of 1.49 mg/dL (H)).   Assessment: 54 yo M with hx of asx PE (around May 2021) in the setting of stage VI bladder cancer. Held Apixaban PTA, LD 9/26 9am. Pharmacy consulted for heparin.  Will need to dose based on aPTT until correlation with heparin levels.  9/28 AM update:  APTT therapeutic x 2   Goal of Therapy:  Monitor platelets by anticoagulation protocol: Yes  Heparin level 0.3-0.7 units/mL APTT 66-102 sec   Plan:  Cont heparin at 1200 units/hr Daily CBC/aPTT/HL Monitor for bleeding  Narda Bonds, PharmD, BCPS Clinical Pharmacist Phone: [phone]

## 2020-05-18 NOTE — Discharge Summary (Signed)
Physician Discharge Summary  Darren Brady. 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  The patient was recently seen in the emergency department on 04/29/2020 where a CT of the abdomen and pelvis at that time showed a small bowel obstruction with transition point in the right lower quadrant.  Admission was recommended, but the patient did not want to be admitted.  He went home and placed himself on a clear liquid diet and used Ex-Lax for 24 hours after which the patient had a bowel movement with improvement of his symptoms.  He was doing fine until the evening of 05/15/2020 when he began having some abdominal pain after eating some low country boil.  His abdominal pain progressed and he began vomiting in the afternoon of 05/16/2020.  He states that he used a rectal suppository before he came to the emergency department resulting in a small bowel movement prior to coming to the emergency department.  He denies any fevers, chills, headache, chest pain, shortness breath, coughing, hemoptysis, diarrhea, hematochezia, melena, hematuria. In the emergency department, the patient had low-grade temperature of 99.7 F.  He was hemodynamically stable with oxygen saturation 95% room air.  BMP was unremarkable with serum creatinine 1.48 which is near his baseline.  LFTs were unremarkable.  WBC 9.9, hemoglobin 10.5, platelets 208,000.  CT of the abdomen and pelvis showed small bowel obstruction with  transition zone in the right lower quadrant that was adjacent to the ileal conduit.  There is concern for adhesion secondary to a mesenteric nodule causing the SBO.  General surgery is consulted to assist with management.  NG tube was placed with improvement of his abdominal pain and distention and vomiting.  Apparently, [facility] was contacted, but they were not accepting any transfers.  As result, the patient was admitted any pain hospital for further evaluation and treatment.  Overall, his abdomen was slowly improving with NG decompression and ambulation.  His abd pain and distension slowly improved and he was passing flatus, but had not had BM, nor had his diet been advanced.  On the morning of 05/18/20, the patient expressed that he wanted to leave AMA. He pulled his NG tube out without informing medical team. He states he is frustrated being here and felt he was getting better care at [facility].  I discussed the risks, benefits and alternatives of him leaving.  He states he was going to contact [facility] and go there next time.  In speaking with Darren Brady., Darren Brady. has demonstrated the ability to understand his medical condition(s) which include small bowel obstruction.  Darren Brady. has demonstrated the ability to appreciate how treatment for small bowel obstruction will be beneficial.   Darren Brady. has also demonstrated the ability to understand and appreciate how refusal of treatement for small bowel obstruction could result in harm, repeat hospitalization, and possibly death.  Darren Brady. demonstrates the ability to reason through the risks and benefits of the proposed treatment.  Finally, Darren Brady. is able to clearly communicate his/her choice.   Discharge Diagnoses:  Small bowel obstruction -Continue NG tube decompression -General surgery consult appreciated-->continue NG decompression and remained NPO -was  advanced to clears am 9/28 -Continue IV fluids -Judicious opioids -Optimize electrolytes -05/16/2020 CT abdomen as discussed above -passing flatus, no BM yet  CKD stage IIIa -Baseline creatinine 1.3-1.5 -A.m. 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 -Status post cystoprostatectomy with ileal conduit May 2020 -Patient follows with medical oncology at [facility] -Patient has follow-up appointment on 05/20/2020 with possible restart of chemotherapy       Discharge Instructions   Allergies as of 05/18/2020   No Known Allergies     Medication List    STOP taking these medications   tamsulosin 0.4 MG Caps capsule Commonly known as: FLOMAX     TAKE these medications   dexamethasone 4 MG tablet Commonly known as: DECADRON Take 8mg  (2 x 4mg  tablets) by mouth in the morning for 2 days after the first day of each cycle, then as directed.   docusate sodium 50 MG capsule Commonly known as: COLACE Take 50 mg by mouth 2 (two) times daily as needed.   Eliquis 5 MG Tabs tablet Generic drug: apixaban Take 5 mg by mouth every 12 (twelve) hours.   ferrous sulfate 325 (65 FE) MG tablet Take 325 mg by mouth daily.   gabapentin 300 MG capsule Commonly known as: NEURONTIN Take 300 mg by mouth See admin instructions. Take 1 capsule by mouth in the morning and 1 capsule at night   rOPINIRole 0.25 MG tablet Commonly known as: REQUIP Take 0.75 tablets by mouth daily after supper.   Testosterone 20.25 MG/ACT (1.62%) Gel Apply 1 Pump topically daily. Apply 1 pump to shoulder areas       No Known Allergies  Consultations:  General surgery   Procedures/Studies: CT Abdomen  Pelvis W Contrast  Result Date: 05/16/2020 CLINICAL DATA:  Abdominal distension. Fever. History of bladder and prostate cancer. EXAM: CT ABDOMEN AND PELVIS WITH CONTRAST TECHNIQUE: Multidetector CT imaging of the abdomen and pelvis was performed using the standard protocol following bolus administration of intravenous contrast. CONTRAST:  138mL OMNIPAQUE IOHEXOL 300 MG/ML  SOLN COMPARISON:  CT 04/29/2020 FINDINGS: Lower chest: Chronic basilar atelectasis. No confluent consolidation. No pleural effusion. Hepatobiliary: No focal hepatic abnormality. Calcified gallstone without pericholecystic inflammation. No biliary dilatation. Pancreas: No ductal dilatation or inflammation. Spleen: Normal in size without focal abnormality. Adrenals/Urinary Tract: Left adrenal thickening with 11 mm nodule adjacent, series 2, image 26, not significant changed. No right adrenal nodule. Small cysts in the right kidney. No right hydronephrosis. The left kidney is small and irregular with innumerable cortical lesions of varying sizes and densities. There is been previous renal biopsy. Nonobstructing stone in the lower left kidney. The previous air adjacent to the kidney has resolved. Irregular soft tissue density extends along the renal pelvis and at the level of the renal vein. Left posterior perinephric nodule has increased currently measuring 15 mm, series 2, image 35, previously subcentimeter. There is stable dilatation of the left renal pelvis with layering stones. There are stones within the distal left ureter where it crosses the midline with mild ureteral dilatation,  the stones are new from prior. The more distal aspect of the ileal conduit is decompressed. Prior cystectomy Stomach/Bowel: Fluid within the stomach. There are dilated loops of small bowel in the left abdomen. Fecalization of small bowel contents in the pelvis where there is transition in the right lower quadrant adjacent to the ileal conduit, series 2, image 63.  This is similar in location to prior exam. The more distal small bowel is decompressed. There are enteric sutures within the distal ileum. Small to moderate volume of stool in the colon. No colonic wall thickening or inflammation. Vascular/Lymphatic: Aortic atherosclerosis without aneurysm. Patent portal vein. Soft tissue density in the left retroperitoneum adjacent to the left kidney appears contiguous with renal lesions rather than separate adenopathy. There multiple small lower periaortic nodes. No enlarged pelvic lymph nodes. Reproductive: Prostatectomy Other: Small amounts of ascites in the lower mesentery and pelvis, slightly increased from prior exam. Small amount of right upper quadrant ascites is new. Ill-defined soft tissue density in the region of the prostate of cystectomy bed, series 2, image 85. Musculoskeletal: Small lucency in the right pubic body adjacent to the pubic symphysis with adjacent sclerotic focus. This is more conspicuous than on prior exam. IMPRESSION: 1. Small-bowel obstruction with transition point in the right lower quadrant adjacent to the ileal conduit, similar in location to prior exam. This may be due to adhesions, however the possibility of underlying mesenteric nodule as cause for obstruction is not excluded. 2. History of left renal neoplasm. Increased size of left posterior perinephric nodule, now measuring 15 mm, previously subcentimeter. This is suspicious for metastatic disease. 3. Atrophic left kidney with multiple ill-defined lesions. Left hydronephrosis with unchanged stones in the dilated left renal pelvis. There additional stones in the more distal left ureter at the level of the midline. These do not appear to be causing hydronephrosis. 4. Cysto prostatectomy. There is ill-defined soft tissue density in the operative bed. 5. Small lucency in the right pubic body adjacent to the pubic symphysis with adjacent sclerotic focus, more conspicuous than on prior exam. This  may represent a metastatic lesion, however is nonspecific. 6. Cholelithiasis without gallbladder inflammation. 7. Small amount of ascites in the lower mesentery and pelvis, slightly increased from prior exam. Small amount of right upper quadrant ascites is new. Aortic Atherosclerosis (ICD10-I70.0). Electronically Signed   By: Keith Rake M.D.   On: 05/16/2020 23:17   CT ABDOMEN PELVIS W CONTRAST  Result Date: 04/29/2020 CLINICAL DATA:  Abdominal distension with nausea. Acute nonlocalized abdominal pain. Patient had renal biopsy yesterday at [facility]. History of bladder and prostate cancer. EXAM: CT ABDOMEN AND PELVIS WITH CONTRAST TECHNIQUE: Multidetector CT imaging of the abdomen and pelvis was performed using the standard protocol following bolus administration of intravenous contrast. CONTRAST:  16mL OMNIPAQUE IOHEXOL 300 MG/ML  SOLN COMPARISON:  Most recent abdominopelvic CT available 09/01/2019. Report from abdominal CT 04/16/2020 performed at an outside institution reviewed. FINDINGS: Lower chest: Linear atelectasis in the left greater than right lower lobe. No pleural fluid. No basilar pneumothorax. Hepatobiliary: No focal liver abnormality is seen. No gallstones, gallbladder wall thickening, or biliary dilatation. Pancreas: No ductal dilatation or inflammation. Spleen: Homogeneous density without focal abnormality. No perisplenic bleed. Adrenals/Urinary Tract: Normal right adrenal gland. Ill-defined density adjacent to the left adrenal gland measures approximately 11 mm, series 2, image 23. Atrophic left renal parenchyma with ill-defined corticomedullary definity shin and renal parenchymal atrophy. Lesion in the lower pole measures approximately 19 mm and has internal focus of  air, may represent site of biopsy. There is no evidence of adjacent hemorrhage. There is an exophytic lesion from the upper left kidney measuring 2.8 cm. There are nonobstructing stones in the lower kidney. There is left  hydronephrosis with layering stones in the upper left ureter, series 2, image 40, the stones are nonobstructing. More distal ureter is dilated to the ileal conduit. There is no definite ureteral thickening or inflammatory change. No evidence of post biopsy hemorrhage. Mild prominence of the right renal collecting system tapering to the ileal conduit without frank hydronephrosis. Small low-density lesions in the right kidney likely represent cysts. Cystectomy with right lower quadrant ileal conduit. Stomach/Bowel: Stomach distended with intraluminal fluid. There is a tiny hiatal hernia. Dilated fluid-filled bowel loops with fecalization of small bowel contents in the lower abdomen. Transition point in the right lower quadrant, series 2, image 60 and series 5, image 49. Transition is adjacent to the ileal conduit. There is mesenteric edema and small amount of free fluid in the region of most prominent small bowel dilatation. No evidence of bowel pneumatosis. Enteric sutures are noted in the more distal small bowel. More distal small bowel is decompressed. Appendix not visualized. Small volume of colonic stool. Vascular/Lymphatic: Aortic atherosclerosis. No aortic aneurysm. Patent portal vein. Left periaortic node measures 15 mm, series 2, image 35. Tiny left perinephric nodule, series 2, image 33. There is no pelvic adenopathy. Reproductive: Prostatectomy. Other: Small amount of left retroperitoneal air likely related to renal biopsy. Small amount of ascites and free fluid scattered throughout the mesentery and in the pelvis. There is no evidence of omental thickening. There is no free air. Small fat containing umbilical hernia. Musculoskeletal: No acute osseous abnormality or focal bone lesion. No evidence of intramuscular bleed post recent renal biopsy. IMPRESSION: 1. Small-bowel obstruction with transition point in the right lower quadrant, likely secondary to adhesions. Site of obstruction is adjacent to ileal  conduit. 2. History of left renal neoplasm and biopsy yesterday at an outside institution. Small amount of air within the left lower pole lesion and in the left retroperitoneum is likely related to recent biopsy. There is no evidence of post biopsy hemorrhage. Staging of malignancy is difficult due to lack of direct comparison, and reference outside CT for complete malignancy staging. 3. Left hydronephrosis with layering stones in the upper left ureter, the stones are nonobstructing. More distal ureter is dilated to the ileal conduit without definite ureteral thickening or inflammatory change. 4. Left periaortic adenopathy. 5. Cysto prostatectomy with ileal conduit. 6. Small volume of free fluid and ascites. Aortic Atherosclerosis (ICD10-I70.0). Electronically Signed   By: Keith Rake M.D.   On: 04/29/2020 18:43   DG Chest Portable 1 View  Result Date: 05/17/2020 CLINICAL DATA:  NG tube placement. Post left renal biopsy, undergoing treatment for bladder cancer EXAM: PORTABLE CHEST 1 VIEW COMPARISON:  Radiograph 05/15/2019, CT 09/01/2019 FINDINGS: Transesophageal tube tip is seen terminating in the left upper quadrant likely in the region of the gastric fundus with the side port beyond the margins of imaging, below the GE junction. Additional external tubing overlies the patient. Accessed left IJ approach Port-A-Cath tip terminates at the lower SVC. Bandlike opacity in the right mid lung likely reflecting subsegmental atelectasis. Additional streaky atelectatic changes in the lung bases. No focal consolidation or convincing features of edema. No concerning nodules or masses. No pneumothorax or visible effusion. Cardiomediastinal contours are unremarkable. No acute osseous or soft tissue abnormality. IMPRESSION: Transesophageal tube tip is seen in the  left upper quadrant likely in the region of the gastric fundus with the side port beyond the margins of imaging, below the GE junction. Electronically Signed    By: Lovena Le M.D.   On: 05/17/2020 01:42        Discharge Exam: Vitals:   05/17/20 2140 05/18/20 0547  BP: 116/79 115/75  Pulse: 81 73  Resp: 20 20  Temp: 99.1 F (37.3 C) 98.5 F (36.9 C)  SpO2: 96% 97%   Vitals:   05/17/20 1400 05/17/20 1816 05/17/20 2140 05/18/20 0547  BP: 120/77 125/84 116/79 115/75  Pulse: 71 82 81 73  Resp: 16 16 20 20   Temp:   99.1 F (37.3 C) 98.5 F (36.9 C)  TempSrc:   Oral   SpO2: 94% 99% 96% 97%  Weight:      Height:        General: Pt is alert, awake, not in acute distress Cardiovascular: RRR, S1/S2 +, no rubs, no gallops Respiratory: CTA bilaterally, no wheezing, no rhonchi Abdominal: Soft, NT, ND, bowel sounds + Extremities: no edema, no cyanosis   The results of significant diagnostics from this hospitalization (including imaging, microbiology, ancillary and laboratory) are listed below for reference.    Significant Diagnostic Studies: CT Abdomen Pelvis W Contrast  Result Date: 05/16/2020 CLINICAL DATA:  Abdominal distension. Fever. History of bladder and prostate cancer. EXAM: CT ABDOMEN AND PELVIS WITH CONTRAST TECHNIQUE: Multidetector CT imaging of the abdomen and pelvis was performed using the standard protocol following bolus administration of intravenous contrast. CONTRAST:  18mL OMNIPAQUE IOHEXOL 300 MG/ML  SOLN COMPARISON:  CT 04/29/2020 FINDINGS: Lower chest: Chronic basilar atelectasis. No confluent consolidation. No pleural effusion. Hepatobiliary: No focal hepatic abnormality. Calcified gallstone without pericholecystic inflammation. No biliary dilatation. Pancreas: No ductal dilatation or inflammation. Spleen: Normal in size without focal abnormality. Adrenals/Urinary Tract: Left adrenal thickening with 11 mm nodule adjacent, series 2, image 26, not significant changed. No right adrenal nodule. Small cysts in the right kidney. No right hydronephrosis. The left kidney is small and irregular with innumerable cortical lesions  of varying sizes and densities. There is been previous renal biopsy. Nonobstructing stone in the lower left kidney. The previous air adjacent to the kidney has resolved. Irregular soft tissue density extends along the renal pelvis and at the level of the renal vein. Left posterior perinephric nodule has increased currently measuring 15 mm, series 2, image 35, previously subcentimeter. There is stable dilatation of the left renal pelvis with layering stones. There are stones within the distal left ureter where it crosses the midline with mild ureteral dilatation, the stones are new from prior. The more distal aspect of the ileal conduit is decompressed. Prior cystectomy Stomach/Bowel: Fluid within the stomach. There are dilated loops of small bowel in the left abdomen. Fecalization of small bowel contents in the pelvis where there is transition in the right lower quadrant adjacent to the ileal conduit, series 2, image 63. This is similar in location to prior exam. The more distal small bowel is decompressed. There are enteric sutures within the distal ileum. Small to moderate volume of stool in the colon. No colonic wall thickening or inflammation. Vascular/Lymphatic: Aortic atherosclerosis without aneurysm. Patent portal vein. Soft tissue density in the left retroperitoneum adjacent to the left kidney appears contiguous with renal lesions rather than separate adenopathy. There multiple small lower periaortic nodes. No enlarged pelvic lymph nodes. Reproductive: Prostatectomy Other: Small amounts of ascites in the lower mesentery and pelvis, slightly increased from prior  exam. Small amount of right upper quadrant ascites is new. Ill-defined soft tissue density in the region of the prostate of cystectomy bed, series 2, image 85. Musculoskeletal: Small lucency in the right pubic body adjacent to the pubic symphysis with adjacent sclerotic focus. This is more conspicuous than on prior exam. IMPRESSION: 1. Small-bowel  obstruction with transition point in the right lower quadrant adjacent to the ileal conduit, similar in location to prior exam. This may be due to adhesions, however the possibility of underlying mesenteric nodule as cause for obstruction is not excluded. 2. History of left renal neoplasm. Increased size of left posterior perinephric nodule, now measuring 15 mm, previously subcentimeter. This is suspicious for metastatic disease. 3. Atrophic left kidney with multiple ill-defined lesions. Left hydronephrosis with unchanged stones in the dilated left renal pelvis. There additional stones in the more distal left ureter at the level of the midline. These do not appear to be causing hydronephrosis. 4. Cysto prostatectomy. There is ill-defined soft tissue density in the operative bed. 5. Small lucency in the right pubic body adjacent to the pubic symphysis with adjacent sclerotic focus, more conspicuous than on prior exam. This may represent a metastatic lesion, however is nonspecific. 6. Cholelithiasis without gallbladder inflammation. 7. Small amount of ascites in the lower mesentery and pelvis, slightly increased from prior exam. Small amount of right upper quadrant ascites is new. Aortic Atherosclerosis (ICD10-I70.0). Electronically Signed   By: Keith Rake M.D.   On: 05/16/2020 23:17   CT ABDOMEN PELVIS W CONTRAST  Result Date: 04/29/2020 CLINICAL DATA:  Abdominal distension with nausea. Acute nonlocalized abdominal pain. Patient had renal biopsy yesterday at [facility]. History of bladder and prostate cancer. EXAM: CT ABDOMEN AND PELVIS WITH CONTRAST TECHNIQUE: Multidetector CT imaging of the abdomen and pelvis was performed using the standard protocol following bolus administration of intravenous contrast. CONTRAST:  124mL OMNIPAQUE IOHEXOL 300 MG/ML  SOLN COMPARISON:  Most recent abdominopelvic CT available 09/01/2019. Report from abdominal CT 04/16/2020 performed at an outside institution reviewed. FINDINGS:  Lower chest: Linear atelectasis in the left greater than right lower lobe. No pleural fluid. No basilar pneumothorax. Hepatobiliary: No focal liver abnormality is seen. No gallstones, gallbladder wall thickening, or biliary dilatation. Pancreas: No ductal dilatation or inflammation. Spleen: Homogeneous density without focal abnormality. No perisplenic bleed. Adrenals/Urinary Tract: Normal right adrenal gland. Ill-defined density adjacent to the left adrenal gland measures approximately 11 mm, series 2, image 23. Atrophic left renal parenchyma with ill-defined corticomedullary definity shin and renal parenchymal atrophy. Lesion in the lower pole measures approximately 19 mm and has internal focus of air, may represent site of biopsy. There is no evidence of adjacent hemorrhage. There is an exophytic lesion from the upper left kidney measuring 2.8 cm. There are nonobstructing stones in the lower kidney. There is left hydronephrosis with layering stones in the upper left ureter, series 2, image 40, the stones are nonobstructing. More distal ureter is dilated to the ileal conduit. There is no definite ureteral thickening or inflammatory change. No evidence of post biopsy hemorrhage. Mild prominence of the right renal collecting system tapering to the ileal conduit without frank hydronephrosis. Small low-density lesions in the right kidney likely represent cysts. Cystectomy with right lower quadrant ileal conduit. Stomach/Bowel: Stomach distended with intraluminal fluid. There is a tiny hiatal hernia. Dilated fluid-filled bowel loops with fecalization of small bowel contents in the lower abdomen. Transition point in the right lower quadrant, series 2, image 60 and series 5, image 49. Transition is  adjacent to the ileal conduit. There is mesenteric edema and small amount of free fluid in the region of most prominent small bowel dilatation. No evidence of bowel pneumatosis. Enteric sutures are noted in the more distal  small bowel. More distal small bowel is decompressed. Appendix not visualized. Small volume of colonic stool. Vascular/Lymphatic: Aortic atherosclerosis. No aortic aneurysm. Patent portal vein. Left periaortic node measures 15 mm, series 2, image 35. Tiny left perinephric nodule, series 2, image 33. There is no pelvic adenopathy. Reproductive: Prostatectomy. Other: Small amount of left retroperitoneal air likely related to renal biopsy. Small amount of ascites and free fluid scattered throughout the mesentery and in the pelvis. There is no evidence of omental thickening. There is no free air. Small fat containing umbilical hernia. Musculoskeletal: No acute osseous abnormality or focal bone lesion. No evidence of intramuscular bleed post recent renal biopsy. IMPRESSION: 1. Small-bowel obstruction with transition point in the right lower quadrant, likely secondary to adhesions. Site of obstruction is adjacent to ileal conduit. 2. History of left renal neoplasm and biopsy yesterday at an outside institution. Small amount of air within the left lower pole lesion and in the left retroperitoneum is likely related to recent biopsy. There is no evidence of post biopsy hemorrhage. Staging of malignancy is difficult due to lack of direct comparison, and reference outside CT for complete malignancy staging. 3. Left hydronephrosis with layering stones in the upper left ureter, the stones are nonobstructing. More distal ureter is dilated to the ileal conduit without definite ureteral thickening or inflammatory change. 4. Left periaortic adenopathy. 5. Cysto prostatectomy with ileal conduit. 6. Small volume of free fluid and ascites. Aortic Atherosclerosis (ICD10-I70.0). Electronically Signed   By: Keith Rake M.D.   On: 04/29/2020 18:43   DG Chest Portable 1 View  Result Date: 05/17/2020 CLINICAL DATA:  NG tube placement. Post left renal biopsy, undergoing treatment for bladder cancer EXAM: PORTABLE CHEST 1 VIEW  COMPARISON:  Radiograph 05/15/2019, CT 09/01/2019 FINDINGS: Transesophageal tube tip is seen terminating in the left upper quadrant likely in the region of the gastric fundus with the side port beyond the margins of imaging, below the GE junction. Additional external tubing overlies the patient. Accessed left IJ approach Port-A-Cath tip terminates at the lower SVC. Bandlike opacity in the right mid lung likely reflecting subsegmental atelectasis. Additional streaky atelectatic changes in the lung bases. No focal consolidation or convincing features of edema. No concerning nodules or masses. No pneumothorax or visible effusion. Cardiomediastinal contours are unremarkable. No acute osseous or soft tissue abnormality. IMPRESSION: Transesophageal tube tip is seen in the left upper quadrant likely in the region of the gastric fundus with the side port beyond the margins of imaging, below the GE junction. Electronically Signed   By: Lovena Le M.D.   On: 05/17/2020 01:42     Microbiology: Recent Results (from the past 240 hour(s))  Respiratory Panel by RT PCR (Flu A&B, Covid) - Nasopharyngeal Swab     Status: None   Collection Time: 05/16/20  8:53 PM   Specimen: Nasopharyngeal Swab  Result Value Ref Range Status   SARS Coronavirus 2 by RT PCR NEGATIVE NEGATIVE Final    Comment: (NOTE) SARS-CoV-2 target nucleic acids are NOT DETECTED.  The SARS-CoV-2 RNA is generally detectable in upper respiratoy specimens during the acute phase of infection. The lowest concentration of SARS-CoV-2 viral copies this assay can detect is 131 copies/mL. A negative result does not preclude SARS-Cov-2 infection and should not be used as  the sole basis for treatment or other patient management decisions. A negative result may occur with  improper specimen collection/handling, submission of specimen other than nasopharyngeal swab, presence of viral mutation(s) within the areas targeted by this assay, and inadequate number  of viral copies (<131 copies/mL). A negative result must be combined with clinical observations, patient history, and epidemiological information. The expected result is Negative.  Fact Sheet for Patients:  PinkCheek.be  Fact Sheet for Healthcare Providers:  GravelBags.it  This test is no t yet approved or cleared by the [place] FDA and  has been authorized for detection and/or diagnosis of SARS-CoV-2 by FDA under an Emergency Use Authorization (EUA). This EUA will remain  in effect (meaning this test can be used) for the duration of the COVID-19 declaration under Section 564(b)(1) of the Act, 21 U.S.C. section 360bbb-3(b)(1), unless the authorization is terminated or revoked sooner.     Influenza A by PCR NEGATIVE NEGATIVE Final   Influenza B by PCR NEGATIVE NEGATIVE Final    Comment: (NOTE) The Xpert Xpress SARS-CoV-2/FLU/RSV assay is intended as an aid in  the diagnosis of influenza from Nasopharyngeal swab specimens and  should not be used as a sole basis for treatment. Nasal washings and  aspirates are unacceptable for Xpert Xpress SARS-CoV-2/FLU/RSV  testing.  Fact Sheet for Patients: PinkCheek.be  Fact Sheet for Healthcare Providers: GravelBags.it  This test is not yet approved or cleared by the [place] FDA and  has been authorized for detection and/or diagnosis of SARS-CoV-2 by  FDA under an Emergency Use Authorization (EUA). This EUA will remain  in effect (meaning this test can be used) for the duration of the  Covid-19 declaration under Section 564(b)(1) of the Act, 21  U.S.C. section 360bbb-3(b)(1), unless the authorization is  terminated or revoked. Performed at [facility], [address], [place],  [address]      Labs: Basic Metabolic Panel: Recent Labs  Lab 05/16/20 2124 05/17/20 0534  NA 135 138  K 4.2 4.6  CL 102  101  CO2 22 25  GLUCOSE 125* 117*  BUN 25* 26*  CREATININE 1.48* 1.49*  CALCIUM 10.1 9.3  MG  --  2.1  PHOS  --  4.0   Liver Function Tests: Recent Labs  Lab 05/16/20 2124 05/17/20 0534  AST 16 13*  ALT 18 15  ALKPHOS 86 80  BILITOT 0.7 0.6  PROT 7.1 6.4*  ALBUMIN 4.0 3.6   Recent Labs  Lab 05/16/20 2124  LIPASE 27   No results for input(s): AMMONIA in the last 168 hours. CBC: Recent Labs  Lab 05/16/20 2124 05/17/20 0534 05/18/20 0238  WBC 9.9 6.4 5.7  NEUTROABS 8.4*  --   --   HGB 11.5* 10.6* 9.6*  HCT 37.5* 35.1* 31.7*  MCV 84.3 84.4 85.7  PLT 323 308 243   Cardiac Enzymes: No results for input(s): CKTOTAL, CKMB, CKMBINDEX, TROPONINI in the last 168 hours. BNP: Invalid input(s): POCBNP CBG: No results for input(s): GLUCAP in the last 168 hours.  Time coordinating discharge:  36 minutes  Signed:  Orson Eva, DO [facility] Pager: [phone] 05/18/2020, 8:41 AM

## 2020-09-29 ENCOUNTER — Other Ambulatory Visit: Payer: Self-pay | Admitting: Urology

## 2020-12-28 ENCOUNTER — Encounter (HOSPITAL_COMMUNITY): Payer: Self-pay

## 2020-12-30 ENCOUNTER — Encounter (HOSPITAL_COMMUNITY): Payer: Self-pay

## 2021-04-21 DEATH — deceased

## 2021-08-05 IMAGING — DX DG CHEST 1V PORT
1 series · 1 of 1 positions shown · non-contrast
Comparison: Radiograph 05/15/2019, CT 09/01/2019

CLINICAL DATA: NG tube placement. Post left renal biopsy,
undergoing treatment for bladder cancer

EXAM:
PORTABLE CHEST 1 VIEW

[chest ap]
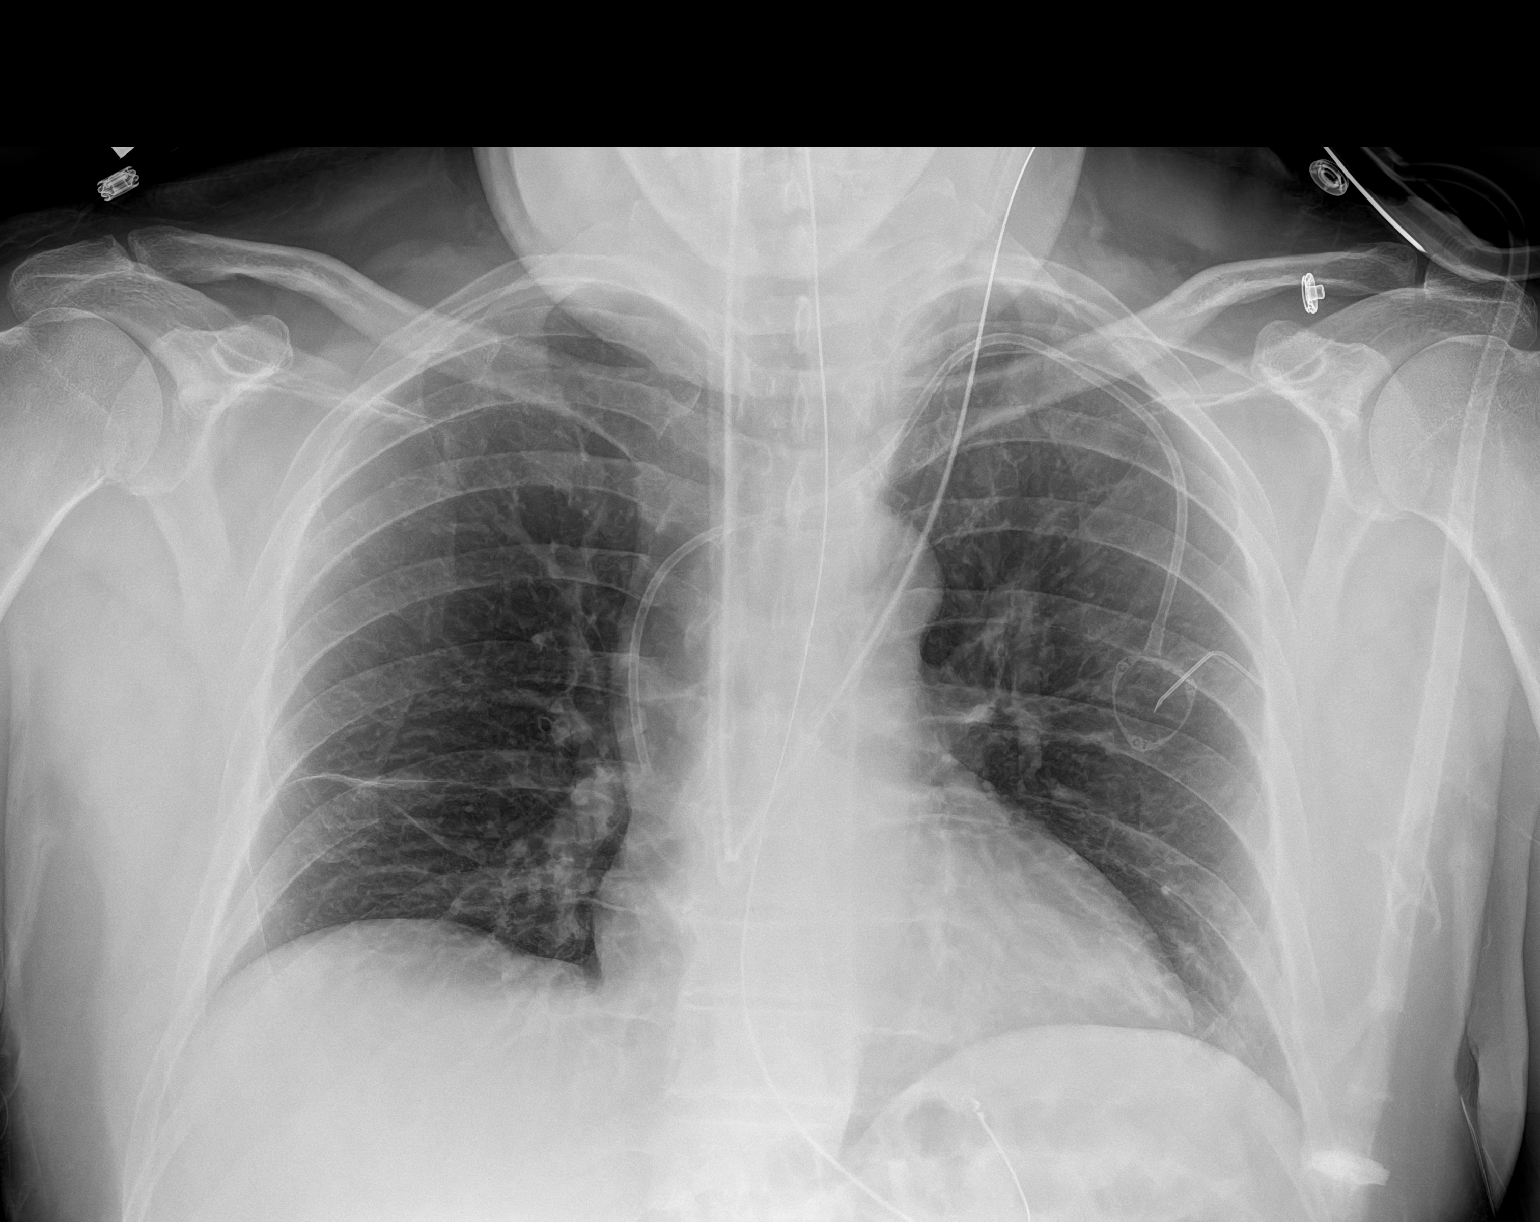

[1 of 1 positions shown; findings below may reference images not displayed]

FINDINGS: Transesophageal tube tip is seen terminating in the left upper
quadrant likely in the region of the gastric fundus with the side
port beyond the margins of imaging, below the GE junction.
Additional external tubing overlies the patient.

Accessed left IJ approach Port-A-Cath tip terminates at the lower
SVC.

Bandlike opacity in the right mid lung likely reflecting
subsegmental atelectasis. Additional streaky atelectatic changes in
the lung bases. No focal consolidation or convincing features of
edema. No concerning nodules or masses. No pneumothorax or visible
effusion. Cardiomediastinal contours are unremarkable. No acute
osseous or soft tissue abnormality.
IMPRESSION: Transesophageal tube tip is seen in the left upper quadrant likely
in the region of the gastric fundus with the side port beyond the
margins of imaging, below the GE junction.
